# Patient Record
Sex: Female | Born: 1965 | Race: White | Hispanic: No | Marital: Single | Smoking: Never smoker
Health system: Southern US, Community
[De-identification: ages and names within clinical notes are randomized; demographics above are authoritative.]

## PROBLEM LIST (undated history)

## (undated) DIAGNOSIS — F419 Anxiety disorder, unspecified: Secondary | ICD-10-CM

## (undated) DIAGNOSIS — Z87442 Personal history of urinary calculi: Secondary | ICD-10-CM

## (undated) DIAGNOSIS — R350 Frequency of micturition: Secondary | ICD-10-CM

## (undated) DIAGNOSIS — Z8659 Personal history of other mental and behavioral disorders: Secondary | ICD-10-CM

## (undated) DIAGNOSIS — D649 Anemia, unspecified: Secondary | ICD-10-CM

## (undated) DIAGNOSIS — M629 Disorder of muscle, unspecified: Secondary | ICD-10-CM

## (undated) DIAGNOSIS — N259 Disorder resulting from impaired renal tubular function, unspecified: Secondary | ICD-10-CM

## (undated) DIAGNOSIS — F329 Major depressive disorder, single episode, unspecified: Secondary | ICD-10-CM

## (undated) DIAGNOSIS — R109 Unspecified abdominal pain: Secondary | ICD-10-CM

## (undated) DIAGNOSIS — T7840XA Allergy, unspecified, initial encounter: Secondary | ICD-10-CM

## (undated) DIAGNOSIS — Q613 Polycystic kidney, unspecified: Secondary | ICD-10-CM

## (undated) DIAGNOSIS — F1910 Other psychoactive substance abuse, uncomplicated: Secondary | ICD-10-CM

## (undated) HISTORY — DX: Anxiety disorder, unspecified: F41.9

## (undated) HISTORY — DX: Personal history of urinary calculi: Z87.442

## (undated) HISTORY — DX: Unspecified abdominal pain: R10.9

## (undated) HISTORY — DX: Allergy, unspecified, initial encounter: T78.40XA

## (undated) HISTORY — DX: Major depressive disorder, single episode, unspecified: F32.9

## (undated) HISTORY — DX: Anemia, unspecified: D64.9

## (undated) HISTORY — DX: Frequency of micturition: R35.0

## (undated) HISTORY — DX: Disorder of muscle, unspecified: M62.9

## (undated) HISTORY — PX: CYSTOSCOPY WITH URETEROSCOPY AND STENT PLACEMENT: SHX6377

## (undated) HISTORY — DX: Disorder resulting from impaired renal tubular function, unspecified: N25.9

## (undated) HISTORY — DX: Personal history of other mental and behavioral disorders: Z86.59

## (undated) HISTORY — DX: Polycystic kidney, unspecified: Q61.3

## (undated) HISTORY — DX: Other psychoactive substance abuse, uncomplicated: F19.10

## (undated) HISTORY — PX: THUMB AMPUTATION: SHX804

## (undated) HISTORY — PX: WISDOM TOOTH EXTRACTION: SHX21

---

## 1999-09-16 ENCOUNTER — Other Ambulatory Visit: Admission: RE | Admit: 1999-09-16 | Discharge: 1999-09-16 | Payer: Self-pay | Admitting: Family Medicine

## 2000-10-05 ENCOUNTER — Other Ambulatory Visit: Admission: RE | Admit: 2000-10-05 | Discharge: 2000-10-05 | Payer: Self-pay | Admitting: Family Medicine

## 2001-10-06 ENCOUNTER — Other Ambulatory Visit: Admission: RE | Admit: 2001-10-06 | Discharge: 2001-10-06 | Payer: Self-pay | Admitting: Family Medicine

## 2002-10-31 ENCOUNTER — Other Ambulatory Visit: Admission: RE | Admit: 2002-10-31 | Discharge: 2002-10-31 | Payer: Self-pay | Admitting: Family Medicine

## 2003-12-10 ENCOUNTER — Other Ambulatory Visit: Admission: RE | Admit: 2003-12-10 | Discharge: 2003-12-10 | Payer: Self-pay | Admitting: Family Medicine

## 2004-12-10 ENCOUNTER — Other Ambulatory Visit: Admission: RE | Admit: 2004-12-10 | Discharge: 2004-12-10 | Payer: Self-pay | Admitting: Family Medicine

## 2004-12-10 ENCOUNTER — Ambulatory Visit: Payer: Self-pay | Admitting: Family Medicine

## 2005-09-24 ENCOUNTER — Ambulatory Visit: Payer: Self-pay | Admitting: Family Medicine

## 2005-12-28 ENCOUNTER — Ambulatory Visit: Payer: Self-pay | Admitting: Family Medicine

## 2005-12-28 ENCOUNTER — Encounter: Payer: Self-pay | Admitting: Family Medicine

## 2005-12-28 ENCOUNTER — Other Ambulatory Visit: Admission: RE | Admit: 2005-12-28 | Discharge: 2005-12-28 | Payer: Self-pay | Admitting: Family Medicine

## 2006-09-02 ENCOUNTER — Encounter: Admission: RE | Admit: 2006-09-02 | Discharge: 2006-09-02 | Payer: Self-pay | Admitting: Family Medicine

## 2006-09-13 ENCOUNTER — Encounter: Admission: RE | Admit: 2006-09-13 | Discharge: 2006-09-13 | Payer: Self-pay | Admitting: Family Medicine

## 2006-09-23 ENCOUNTER — Encounter: Admission: RE | Admit: 2006-09-23 | Discharge: 2006-09-23 | Payer: Self-pay | Admitting: Family Medicine

## 2006-12-02 ENCOUNTER — Ambulatory Visit: Payer: Self-pay | Admitting: Family Medicine

## 2006-12-02 LAB — CONVERTED CEMR LAB
Albumin: 3.6 g/dL (ref 3.5–5.2)
BUN: 24 mg/dL — ABNORMAL HIGH (ref 6–23)
Basophils Relative: 0.1 % (ref 0.0–1.0)
Bilirubin, Direct: 0.1 mg/dL (ref 0.0–0.3)
CO2: 32 meq/L (ref 19–32)
Chloride: 103 meq/L (ref 96–112)
Eosinophils Absolute: 0.2 10*3/uL (ref 0.0–0.6)
Eosinophils Relative: 3.4 % (ref 0.0–5.0)
Glucose, Bld: 75 mg/dL (ref 70–99)
Lymphocytes Relative: 31 % (ref 12.0–46.0)
MCV: 98.9 fL (ref 78.0–100.0)
Monocytes Absolute: 0.4 10*3/uL (ref 0.2–0.7)
Neutro Abs: 3.1 10*3/uL (ref 1.4–7.7)
Neutrophils Relative %: 57.6 % (ref 43.0–77.0)
Platelets: 255 10*3/uL (ref 150–400)
Potassium: 2.8 meq/L — ABNORMAL LOW (ref 3.5–5.1)
RBC: 3.31 M/uL — ABNORMAL LOW (ref 3.87–5.11)
Total Bilirubin: 0.8 mg/dL (ref 0.3–1.2)
Total CHOL/HDL Ratio: 2.6
Total Protein: 6.6 g/dL (ref 6.0–8.3)
VLDL: 13 mg/dL (ref 0–40)

## 2006-12-08 ENCOUNTER — Encounter: Payer: Self-pay | Admitting: Family Medicine

## 2006-12-08 ENCOUNTER — Ambulatory Visit: Payer: Self-pay | Admitting: Family Medicine

## 2006-12-08 ENCOUNTER — Other Ambulatory Visit: Admission: RE | Admit: 2006-12-08 | Discharge: 2006-12-08 | Payer: Self-pay | Admitting: Family Medicine

## 2007-02-14 ENCOUNTER — Encounter: Payer: Self-pay | Admitting: Family Medicine

## 2007-02-24 ENCOUNTER — Ambulatory Visit: Payer: Self-pay | Admitting: Family Medicine

## 2007-02-24 LAB — CONVERTED CEMR LAB
Basophils Absolute: 0 10*3/uL (ref 0.0–0.1)
Basophils Relative: 0.1 % (ref 0.0–1.0)
Calcium: 9.4 mg/dL (ref 8.4–10.5)
Creatinine, Ser: 1.7 mg/dL — ABNORMAL HIGH (ref 0.4–1.2)
GFR calc Af Amer: 43 mL/min
Glucose, Bld: 103 mg/dL — ABNORMAL HIGH (ref 70–99)
Hemoglobin: 12.1 g/dL (ref 12.0–15.0)
MCHC: 35.4 g/dL (ref 30.0–36.0)
MCV: 97.2 fL (ref 78.0–100.0)
Monocytes Absolute: 0.4 10*3/uL (ref 0.2–0.7)
Neutro Abs: 4.3 10*3/uL (ref 1.4–7.7)
Platelets: 223 10*3/uL (ref 150–400)
RBC: 3.52 M/uL — ABNORMAL LOW (ref 3.87–5.11)
RDW: 11.3 % — ABNORMAL LOW (ref 11.5–14.6)

## 2007-03-03 ENCOUNTER — Ambulatory Visit: Payer: Self-pay | Admitting: Family Medicine

## 2007-03-03 LAB — CONVERTED CEMR LAB
BUN: 27 mg/dL — ABNORMAL HIGH (ref 6–23)
Calcium: 10 mg/dL (ref 8.4–10.5)
Glucose, Bld: 85 mg/dL (ref 70–99)
Sodium: 143 meq/L (ref 135–145)

## 2007-03-07 ENCOUNTER — Telehealth: Payer: Self-pay | Admitting: *Deleted

## 2007-06-28 ENCOUNTER — Telehealth: Payer: Self-pay | Admitting: Family Medicine

## 2007-08-01 ENCOUNTER — Telehealth: Payer: Self-pay | Admitting: Family Medicine

## 2007-08-14 ENCOUNTER — Encounter: Payer: Self-pay | Admitting: Family Medicine

## 2007-09-01 ENCOUNTER — Encounter: Admission: RE | Admit: 2007-09-01 | Discharge: 2007-09-01 | Payer: Self-pay | Admitting: Family Medicine

## 2007-09-06 ENCOUNTER — Ambulatory Visit: Payer: Self-pay | Admitting: Sports Medicine

## 2007-09-06 DIAGNOSIS — M629 Disorder of muscle, unspecified: Secondary | ICD-10-CM | POA: Insufficient documentation

## 2007-09-06 DIAGNOSIS — M2420 Disorder of ligament, unspecified site: Secondary | ICD-10-CM

## 2007-09-06 HISTORY — DX: Disorder of ligament, unspecified site: M24.20

## 2007-09-06 HISTORY — DX: Disorder of muscle, unspecified: M62.9

## 2007-10-06 ENCOUNTER — Ambulatory Visit: Payer: Self-pay | Admitting: Sports Medicine

## 2007-11-16 ENCOUNTER — Ambulatory Visit: Payer: Self-pay | Admitting: Family Medicine

## 2007-11-16 LAB — CONVERTED CEMR LAB
AST: 19 units/L (ref 0–37)
Albumin: 3.5 g/dL (ref 3.5–5.2)
Bilirubin, Direct: 0.1 mg/dL (ref 0.0–0.3)
Blood in Urine, dipstick: NEGATIVE
Calcium: 8.8 mg/dL (ref 8.4–10.5)
Chloride: 100 meq/L (ref 96–112)
Creatinine, Ser: 1.6 mg/dL — ABNORMAL HIGH (ref 0.4–1.2)
Folate: 17.2 ng/mL
GFR calc Af Amer: 46 mL/min
GFR calc non Af Amer: 38 mL/min
Glucose, Bld: 71 mg/dL (ref 70–99)
HCT: 31.9 % — ABNORMAL LOW (ref 36.0–46.0)
HDL: 69.2 mg/dL (ref >39.0)
Hemoglobin: 10.6 g/dL — ABNORMAL LOW (ref 12.0–15.0)
Ketones, urine, test strip: NEGATIVE
LDL Cholesterol: 99 mg/dL (ref 0–99)
MCV: 100.7 fL — ABNORMAL HIGH (ref 78.0–100.0)
Nitrite: NEGATIVE
RBC: 3.17 M/uL — ABNORMAL LOW (ref 3.87–5.11)
Saturation Ratios: 44.4 % (ref 20.0–50.0)
Specific Gravity, Urine: 1.015
Total Bilirubin: 0.7 mg/dL (ref 0.3–1.2)
Transferrin: 212.5 mg/dL (ref >=212.0)
Urobilinogen, UA: 0.2
VLDL: 14 mg/dL (ref 0–40)
Vitamin B-12: 497 pg/mL (ref 211–911)
WBC Urine, dipstick: NEGATIVE
WBC: 4.5 10*3/uL (ref 4.5–10.5)

## 2007-12-02 DIAGNOSIS — R109 Unspecified abdominal pain: Secondary | ICD-10-CM

## 2007-12-02 HISTORY — DX: Unspecified abdominal pain: R10.9

## 2007-12-04 ENCOUNTER — Telehealth: Payer: Self-pay | Admitting: Family Medicine

## 2007-12-04 ENCOUNTER — Ambulatory Visit: Payer: Self-pay | Admitting: Family Medicine

## 2007-12-04 DIAGNOSIS — F329 Major depressive disorder, single episode, unspecified: Secondary | ICD-10-CM

## 2007-12-04 DIAGNOSIS — F3289 Other specified depressive episodes: Secondary | ICD-10-CM

## 2007-12-04 DIAGNOSIS — R350 Frequency of micturition: Secondary | ICD-10-CM

## 2007-12-04 HISTORY — DX: Major depressive disorder, single episode, unspecified: F32.9

## 2007-12-04 HISTORY — DX: Frequency of micturition: R35.0

## 2007-12-04 HISTORY — DX: Other specified depressive episodes: F32.89

## 2007-12-04 LAB — CONVERTED CEMR LAB
Ketones, urine, test strip: NEGATIVE
Protein, U semiquant: NEGATIVE
Specific Gravity, Urine: 1.01

## 2007-12-05 ENCOUNTER — Ambulatory Visit: Payer: Self-pay | Admitting: Cardiology

## 2007-12-05 ENCOUNTER — Telehealth: Payer: Self-pay | Admitting: Family Medicine

## 2007-12-05 ENCOUNTER — Ambulatory Visit: Payer: Self-pay | Admitting: Family Medicine

## 2007-12-05 ENCOUNTER — Telehealth (INDEPENDENT_AMBULATORY_CARE_PROVIDER_SITE_OTHER): Payer: Self-pay | Admitting: *Deleted

## 2007-12-05 LAB — CONVERTED CEMR LAB
Blood in Urine, dipstick: NEGATIVE
pH: 5

## 2007-12-08 ENCOUNTER — Telehealth: Payer: Self-pay | Admitting: Family Medicine

## 2007-12-12 ENCOUNTER — Ambulatory Visit: Payer: Self-pay | Admitting: Family Medicine

## 2007-12-12 ENCOUNTER — Other Ambulatory Visit: Admission: RE | Admit: 2007-12-12 | Discharge: 2007-12-12 | Payer: Self-pay | Admitting: Family Medicine

## 2007-12-12 ENCOUNTER — Telehealth: Payer: Self-pay | Admitting: Family Medicine

## 2007-12-12 ENCOUNTER — Encounter: Payer: Self-pay | Admitting: Family Medicine

## 2007-12-12 ENCOUNTER — Telehealth (INDEPENDENT_AMBULATORY_CARE_PROVIDER_SITE_OTHER): Payer: Self-pay | Admitting: *Deleted

## 2007-12-12 DIAGNOSIS — N259 Disorder resulting from impaired renal tubular function, unspecified: Secondary | ICD-10-CM

## 2007-12-12 DIAGNOSIS — Z87442 Personal history of urinary calculi: Secondary | ICD-10-CM

## 2007-12-12 DIAGNOSIS — Z8659 Personal history of other mental and behavioral disorders: Secondary | ICD-10-CM

## 2007-12-12 HISTORY — DX: Personal history of urinary calculi: Z87.442

## 2007-12-12 HISTORY — DX: Disorder resulting from impaired renal tubular function, unspecified: N25.9

## 2007-12-12 HISTORY — DX: Personal history of other mental and behavioral disorders: Z86.59

## 2008-01-12 ENCOUNTER — Ambulatory Visit: Payer: Self-pay

## 2008-01-12 ENCOUNTER — Ambulatory Visit (HOSPITAL_COMMUNITY): Admission: RE | Admit: 2008-01-12 | Discharge: 2008-01-12 | Payer: Self-pay | Admitting: Urology

## 2008-01-12 ENCOUNTER — Emergency Department (HOSPITAL_COMMUNITY): Admission: EM | Admit: 2008-01-12 | Discharge: 2008-01-12 | Payer: Self-pay | Admitting: Emergency Medicine

## 2008-01-29 ENCOUNTER — Ambulatory Visit (HOSPITAL_BASED_OUTPATIENT_CLINIC_OR_DEPARTMENT_OTHER): Admission: RE | Admit: 2008-01-29 | Discharge: 2008-01-29 | Payer: Self-pay | Admitting: Urology

## 2008-01-29 ENCOUNTER — Encounter (INDEPENDENT_AMBULATORY_CARE_PROVIDER_SITE_OTHER): Payer: Self-pay | Admitting: Urology

## 2008-02-06 ENCOUNTER — Encounter: Admission: RE | Admit: 2008-02-06 | Discharge: 2008-02-06 | Payer: Self-pay | Admitting: Nephrology

## 2008-04-22 IMAGING — CT CT PELVIS W/O CM
2 of 4 series · 16 of 46 positions shown, 18 images · non-contrast
Comparison: None

CT ABDOMEN

CLINICAL DATA: RIGHT FLANK PAIN.  FREQUENCY..  EDEMA OF BILATERAL
LOWER EXTREMITIES.  HISTORY OF KIDNEY STONES.

CT ABDOMEN AND PELVIS WITHOUT CONTRAST (CT UROGRAM)
TECHNIQUE: Contiguous axial images of the abdomen and pelvis
without oral or intravenous contrast were obtained.

[Series 2: abd_pel_w/o 5.0 b30f st · axial · 0.68mm/px · z∈[-407,-37]mm · 13 of 82 slices shown, 15 images]
[im 4/82  soft-tissue]
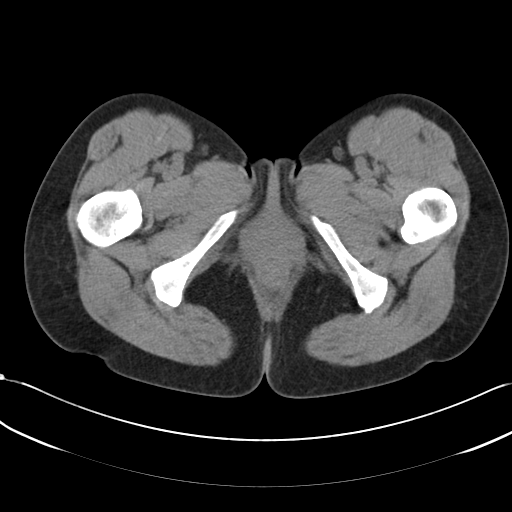
[im 4/82  bone]
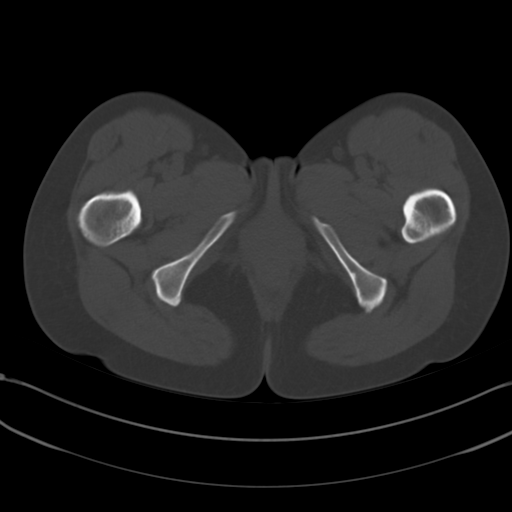
[im 11/82  soft-tissue]
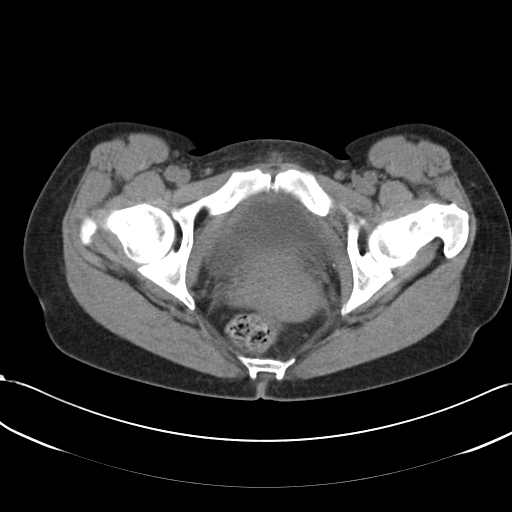
[im 18/82  soft-tissue]
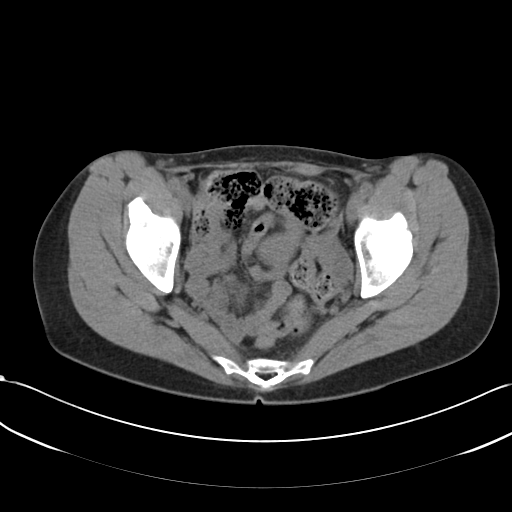
[im 22/82  soft-tissue]
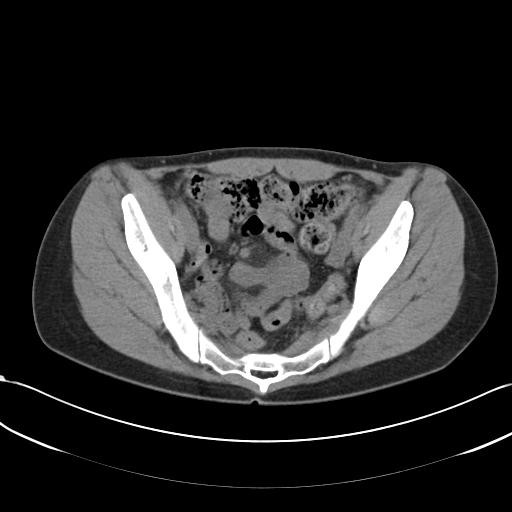
[im 29/82  soft-tissue]
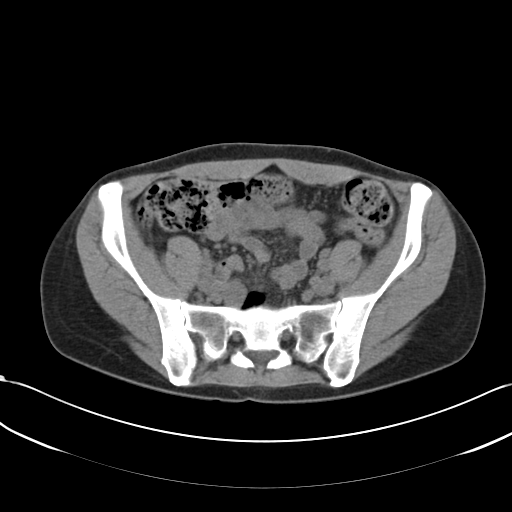
[im 36/82  soft-tissue]
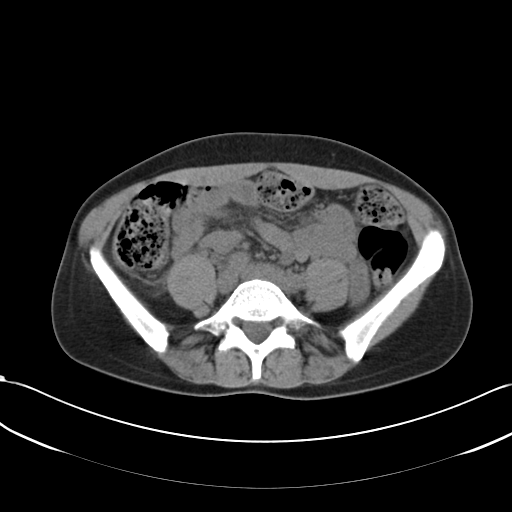
[im 43/82  soft-tissue]
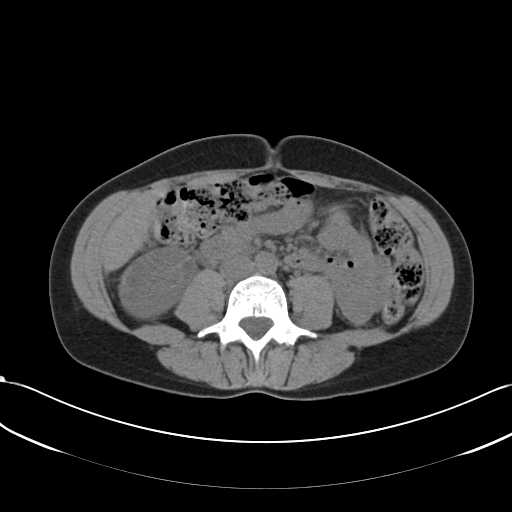
[im 46/82  soft-tissue]
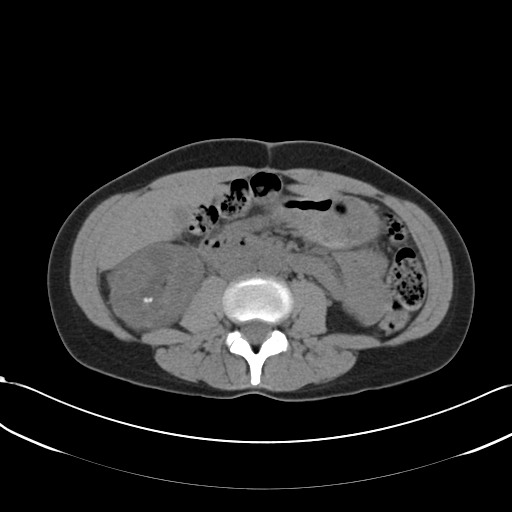
[im 53/82  soft-tissue]
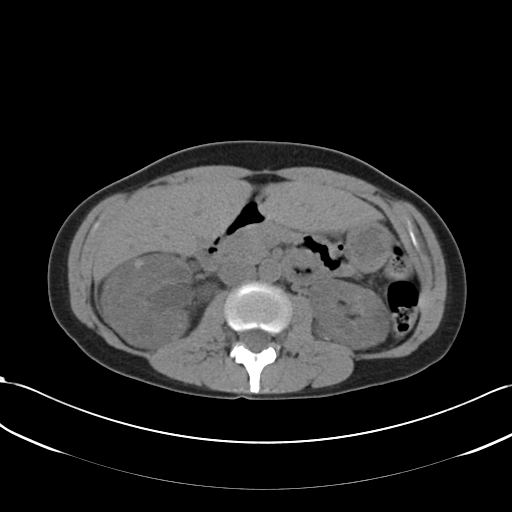
[im 53/82  bone]
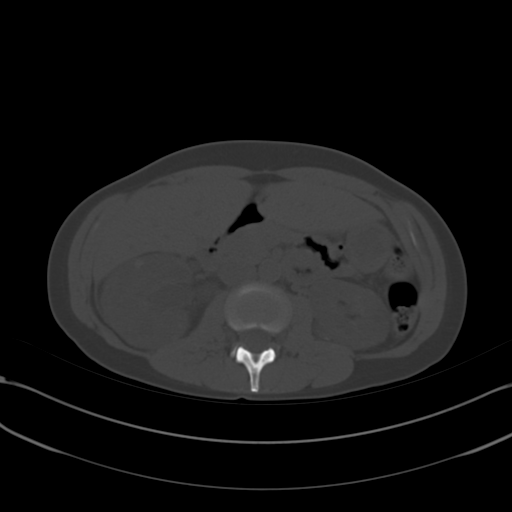
[im 60/82  soft-tissue]
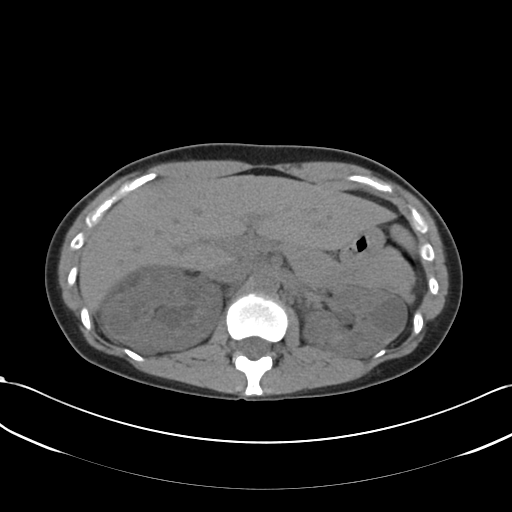
[im 64/82  soft-tissue]
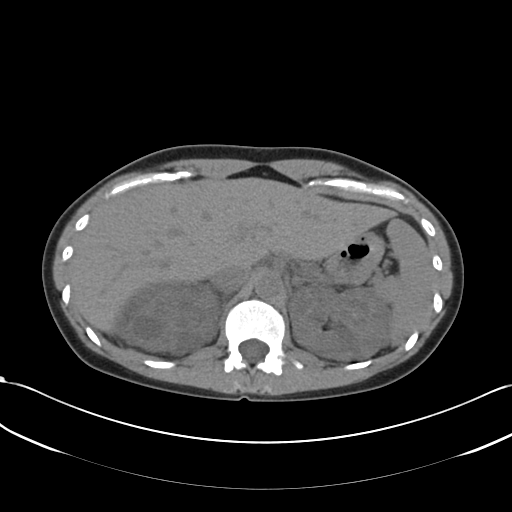
[im 71/82  soft-tissue]
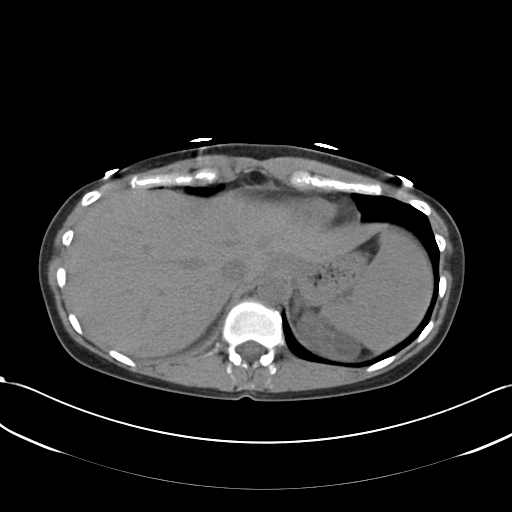
[im 78/82  soft-tissue]
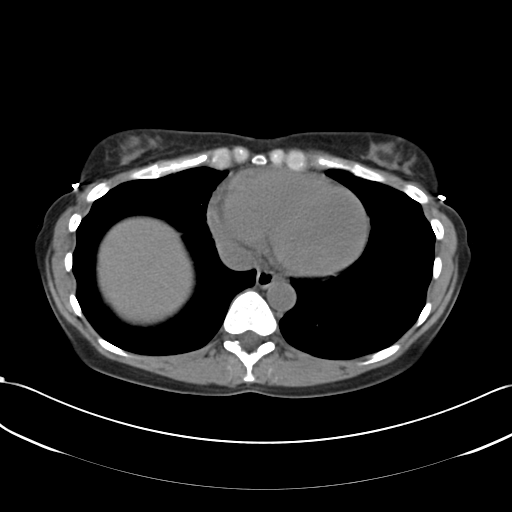

[Series 602: <mpr range> · coronal · 0.81mm/px · 3 of 172 slices shown]
[im 58/172  soft-tissue]
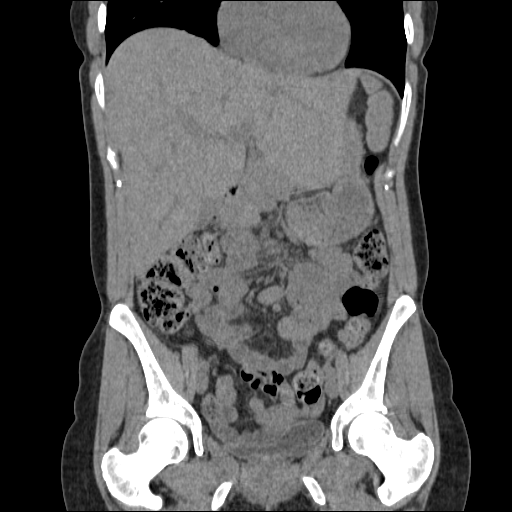
[im 77/172  soft-tissue]
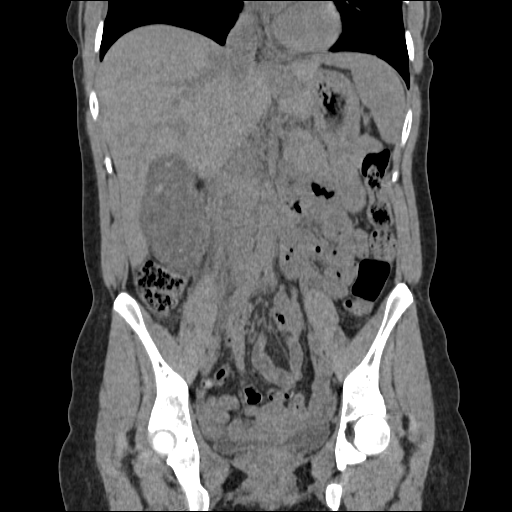
[im 96/172  soft-tissue]
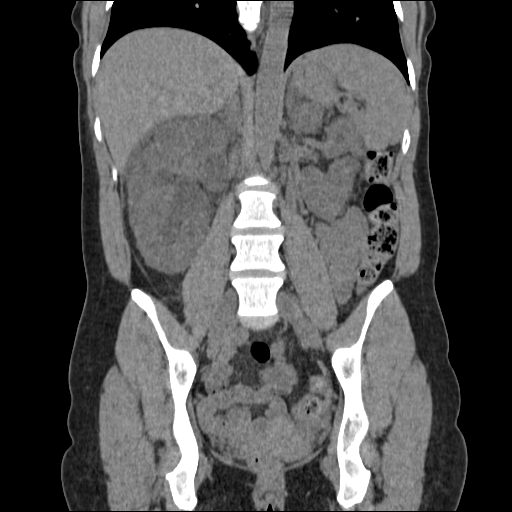

[16 of 46 positions shown; findings below may reference images not displayed]

FINDINGS: Exam is limited for evaluation of entities other than
urinary tract calculi due to lack of oral or intravenous contrast.

 A subpleural right middle lobe 2 mm nodule on image five which is
likely a subpleural lymph node.  Probable anemia as evidenced by
decreased density of the intravascular spaces.  No pericardial or
pleural effusion.  Normal unfused appearance of liver.  Old
granulomatous disease in the spleen.  Normal stomach, pancreas.
Gallbladder is likely contracted.  Normal caliber common duct.  The
parenchyma of both kidneys is replaced by numerous masses likely
cysts and complex cysts.  There is mild right-sided perirenal edema
and hydronephrosis.  Bilateral hemorrhagic renal lesions
identified.  Right lower pole punctate renal calculi.  Right
hydroureter throughout the abdomen.  Small retroperitoneal lymph
nodes but no adenopathy.  Normal colon. The appendix is not
visualized but there is no evidence of right lower quadrant
inflammation.

Normal abdominal small bowel loops without ascites.
IMPRESSION: 1.  Right renal obstructive signs due to distal calculi to be
described below.
2.  Numerous bilateral renal masses suspicious for adult onset
polycystic kidney disease.

3.  Punctate right renal calculi.
4.  Probable anemia.

CT PELVIS
FINDINGS: The distal right ureters are difficult to follow.  A 1
mm calcification is likely the right bladder base on image 71.
Immediately posterior, a second one - 2 mm calcification may be at
the ureterovesicular junction or be vascular in nature.  Normal
pelvic bowel loops.  No pelvic adenopathy.  Normal uterus.  No
adnexal mass. Normal bones.
IMPRESSION: 1.
1.  One and possibly 2 distal right-sided urinary tract calculi,
punctate, as described with right renal obstructive signs.

## 2008-06-28 ENCOUNTER — Telehealth: Payer: Self-pay | Admitting: *Deleted

## 2008-07-25 ENCOUNTER — Telehealth: Payer: Self-pay | Admitting: Internal Medicine

## 2008-09-27 ENCOUNTER — Encounter: Admission: RE | Admit: 2008-09-27 | Discharge: 2008-09-27 | Payer: Self-pay | Admitting: Family Medicine

## 2008-12-06 ENCOUNTER — Ambulatory Visit: Payer: Self-pay | Admitting: Family Medicine

## 2008-12-06 LAB — CONVERTED CEMR LAB
ALT: 14 units/L (ref 0–35)
AST: 20 units/L (ref 0–37)
Albumin: 3.8 g/dL (ref 3.5–5.2)
BUN: 32 mg/dL — ABNORMAL HIGH (ref 6–23)
Blood in Urine, dipstick: NEGATIVE
CO2: 29 meq/L (ref 19–32)
Chloride: 107 meq/L (ref 96–112)
Cholesterol: 213 mg/dL — ABNORMAL HIGH (ref 0–200)
Direct LDL: 107.1 mg/dL
Eosinophils Relative: 3.4 % (ref 0.0–5.0)
HDL: 80.6 mg/dL (ref >39.00)
Ketones, urine, test strip: NEGATIVE
Lymphocytes Relative: 19.6 % (ref 12.0–46.0)
MCHC: 34.8 g/dL (ref 30.0–36.0)
Neutro Abs: 4.9 10*3/uL (ref 1.4–7.7)
Neutrophils Relative %: 69.3 % (ref 43.0–77.0)
Nitrite: NEGATIVE
Platelets: 203 10*3/uL (ref 150.0–400.0)
Specific Gravity, Urine: 1.015
TSH: 2.56 microintl units/mL (ref 0.35–5.50)
Total CHOL/HDL Ratio: 3
Triglycerides: 94 mg/dL (ref 0.0–149.0)
Urobilinogen, UA: 0.2
WBC Urine, dipstick: NEGATIVE
WBC: 7 10*3/uL (ref 4.5–10.5)
pH: 5

## 2008-12-13 ENCOUNTER — Telehealth: Payer: Self-pay | Admitting: Family Medicine

## 2008-12-13 ENCOUNTER — Ambulatory Visit: Payer: Self-pay | Admitting: Family Medicine

## 2008-12-13 DIAGNOSIS — Q613 Polycystic kidney, unspecified: Secondary | ICD-10-CM | POA: Insufficient documentation

## 2008-12-13 HISTORY — DX: Polycystic kidney, unspecified: Q61.3

## 2009-03-19 ENCOUNTER — Encounter (HOSPITAL_COMMUNITY): Admission: RE | Admit: 2009-03-19 | Discharge: 2009-06-17 | Payer: Self-pay | Admitting: Nephrology

## 2009-06-30 ENCOUNTER — Telehealth: Payer: Self-pay | Admitting: Family Medicine

## 2009-07-01 ENCOUNTER — Encounter (HOSPITAL_COMMUNITY): Admission: RE | Admit: 2009-07-01 | Discharge: 2009-07-01 | Payer: Self-pay | Admitting: Nephrology

## 2009-07-15 ENCOUNTER — Telehealth: Payer: Self-pay | Admitting: Family Medicine

## 2009-11-14 ENCOUNTER — Encounter: Admission: RE | Admit: 2009-11-14 | Discharge: 2009-11-14 | Payer: Self-pay | Admitting: Family Medicine

## 2009-12-19 ENCOUNTER — Other Ambulatory Visit: Admission: RE | Admit: 2009-12-19 | Discharge: 2009-12-19 | Payer: Self-pay | Admitting: Family Medicine

## 2009-12-19 ENCOUNTER — Ambulatory Visit: Payer: Self-pay | Admitting: Family Medicine

## 2009-12-24 ENCOUNTER — Telehealth: Payer: Self-pay | Admitting: *Deleted

## 2009-12-31 ENCOUNTER — Encounter (HOSPITAL_COMMUNITY): Admission: RE | Admit: 2009-12-31 | Discharge: 2010-03-25 | Payer: Self-pay | Admitting: Nephrology

## 2010-01-11 LAB — HM MAMMOGRAPHY: HM Mammogram: NEGATIVE

## 2010-03-23 ENCOUNTER — Telehealth: Payer: Self-pay | Admitting: Family Medicine

## 2010-06-17 ENCOUNTER — Telehealth: Payer: Self-pay | Admitting: Family Medicine

## 2010-09-06 ENCOUNTER — Encounter: Payer: Self-pay | Admitting: Family Medicine

## 2010-09-07 ENCOUNTER — Encounter: Payer: Self-pay | Admitting: Urology

## 2010-09-15 NOTE — Progress Notes (Signed)
Summary: increase of meds.  Phone Note Call from Patient   Summary of Call: Pt takes 150 mg. of generic Zoloft, and needs 3 months supply with refills.   [facility]  She increased her own dosage. [phone] Initial call taken by: Lynann Beaver CMA,  March 23, 2010 9:06 AM  Follow-up for Phone Call        ok for i yr Follow-up by: Roderick Pee MD,  March 23, 2010 10:17 AM    Prescriptions: ZOLOFT 100 MG TABS (SERTRALINE HCL) 1 tab @ bedtime  #90 x 3   Entered by:   Kern Reap CMA (AAMA)   Authorized by:   Roderick Pee MD   Signed by:   Kern Reap CMA (AAMA) on 03/23/2010   Method used:   Electronically to        [facility]  [phone]* (retail)       [address] [place], [place]  [address]       Ph: [phone] or [phone]       Fax: [phone]   RxID:   [id number] ZOLOFT 50 MG TABS (SERTRALINE HCL) 1 tab @ bedtime  #90 x 3   Entered by:   Kern Reap CMA (AAMA)   Authorized by:   Roderick Pee MD   Signed by:   Kern Reap CMA (AAMA) on 03/23/2010   Method used:   Electronically to        [facility]  [phone]* (retail)       [address] [place], [place]  [address]       Ph: [phone] or [phone]       Fax: [phone]   RxID:   [id number]

## 2010-09-15 NOTE — Progress Notes (Signed)
Summary: new rx  Phone Note Call from Patient Call back at Work Phone [phone]   Caller: Patient Call For: Catherine Pee MD Summary of Call: pt needs generic ritalin 20 mg twice a day. Initial call taken by: Heron Sabins,  Dec 24, 2009 10:41 AM  Follow-up for Phone Call        rx ready for pick up Follow-up by: Kern Reap CMA Duncan Dull),  Dec 24, 2009 11:16 AM    Prescriptions: METHYLPHENIDATE HCL CR 20 MG CR-TABS (METHYLPHENIDATE HCL) take one tablet two times a day  fill in two months  #60 x 0   Entered by:   Kern Reap CMA (AAMA)   Authorized by:   Catherine Pee MD   Signed by:   Kern Reap CMA (AAMA) on 12/24/2009   Method used:   Print then Give to Patient   RxID:   [id number] METHYLPHENIDATE HCL CR 20 MG CR-TABS (METHYLPHENIDATE HCL) take one tablet two times a day fill in one month  #60 x 0   Entered by:   Kern Reap CMA (AAMA)   Authorized by:   Catherine Pee MD   Signed by:   Kern Reap CMA (AAMA) on 12/24/2009   Method used:   Print then Give to Patient   RxID:   [id number] METHYLPHENIDATE HCL CR 20 MG CR-TABS (METHYLPHENIDATE HCL) take one tablet two times a day  #60 x 0   Entered by:   Kern Reap CMA (AAMA)   Authorized by:   Catherine Pee MD   Signed by:   Kern Reap CMA (AAMA) on 12/24/2009   Method used:   Print then Give to Patient   RxID:   [id number]

## 2010-09-15 NOTE — Assessment & Plan Note (Signed)
Summary: cpx/pap/njr   Vital Signs:  Patient profile:   45 year old female Menstrual status:  irregular Height:      62.5 inches Weight:      106 pounds BMI:     19.15 Temp:     97.8 degrees F oral  Vitals Entered By: Kern Reap CMA Duncan Dull) (Dec 19, 2009 10:44 AM) CC: cpx   CC:  cpx.  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  She is followed by Dr[facility] Raquel [facility].  Her parameters are stable except her hemoglobin is low.  The Procrit is $400 a shot.  She has applied for patient assistance.  She takes methylphenidate 20 mg long-acting b.i.d. for ADD.  She also takes Maxide 25 mg q.a.m. for fluid retention.  She also takes Urised K2 tabs daily from Dr. Shela Commons. D. to prevent kidney stones.  She takes a stool softener on a somewhat daily basis.  She is also due to go see her dermatologist, Dr. Yetta Barre because of some nonhealing lesions, one on her leg, and one on her left hand.  She also takes acyclovir 200 mg daily.  She takes Claritin-D for allergic rhinitis.  She also takes Luvox for mild depression.  However, she is wants to switch to Zoloft because she feels the Luvox is causing sedation  Past History:  Past medical, surgical, family and social histories (including risk factors) reviewed, and no changes noted (except as noted below).  Past Medical History: Reviewed history from 12/12/2007 and no changes required. bulemia ADD Depression  amputation tip of left thumb Renal insufficiency secondary to polycystic kidney disease Nephrolithiasis, hx of  Family History: Reviewed history from 12/04/2007 and no changes required. father polycystic kidneys on the transplant list  mother in good healthno brothers.  One sister in good health  Social History: Reviewed history from 12/04/2007 and no changes required. Avid  runner. 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 Occupation: Single Never Smoked Alcohol use-no Drug use-no Regular exercise-yes  Review of Systems      See HPI  Physical Exam  General:  Well-developed,well-nourished,in no acute distress; alert,appropriate and cooperative throughout examination Head:  Normocephalic and atraumatic without obvious abnormalities. No apparent alopecia or balding. Eyes:  No corneal or conjunctival inflammation noted. EOMI. Perrla. Funduscopic exam benign, without hemorrhages, exudates or papilledema. Vision grossly normal. Ears:  External ear exam shows no significant lesions or deformities.  Otoscopic examination reveals clear canals, tympanic membranes are intact bilaterally without bulging, retraction, inflammation or discharge. Hearing is grossly normal bilaterally. Nose:  External nasal examination shows no deformity or inflammation. Nasal mucosa are pink and moist without lesions or exudates. Mouth:  Oral mucosa and oropharynx without lesions or exudates.  Teeth in good repair. Neck:  No deformities, masses, or tenderness noted. Chest Wall:  No deformities, masses, or tenderness noted. Breasts:  No mass, nodules, thickening, tenderness, bulging, retraction, inflamation, nipple discharge or skin changes noted.   Lungs:  Normal respiratory effort, chest expands symmetrically. Lungs are clear to auscultation, no crackles or wheezes. Heart:  Normal rate and regular rhythm. S1 and S2 normal without gallop, murmur, click, rub or other extra sounds. Abdomen:  Bowel sounds positive,abdomen soft and non-tender without masses, organomegaly or hernias noted. Rectal:  No external abnormalities noted. Normal sphincter tone. No rectal masses or tenderness. Genitalia:  Pelvic Exam:  External: normal female genitalia without lesions or masses        Vagina: normal without lesions or masses        Cervix: normal without lesions or masses        Adnexa:  normal bimanual exam without masses or fullness        Uterus: normal by palpation        Pap smear: performed Msk:  No deformity or scoliosis noted of thoracic or lumbar spine.   Pulses:  R and L carotid,radial,femoral,dorsalis pedis and posterior tibial pulses are full and equal bilaterally Extremities:  No clubbing, cyanosis, edema, or deformity noted with normal full range of motion of all joints.   Neurologic:  No cranial nerve deficits noted. Station and gait are normal. Plantar reflexes are down-going bilaterally. DTRs are symmetrical throughout. Sensory, motor and coordinative functions appear intact. Skin:  there is a lesion that appears on her right posterior calf probable a.k.a. also a couple other lesions that are nonhealing referred to Dr. Yetta Barre.  Dermatology for treatment Cervical Nodes:  No lymphadenopathy noted Axillary Nodes:  No palpable lymphadenopathy Inguinal Nodes:  No significant adenopathy Psych:  Cognition and judgment appear intact. Alert and cooperative with normal attention span and concentration. No apparent delusions, illusions, hallucinations   Impression & Recommendations:  Problem # 1:  POLYCYSTIC KIDNEY DISEASE (ICD-753.12) Assessment Unchanged  Orders: Prescription Created Electronically [phone]) EKG w/ Interpretation (93000)  Problem # 2:  ROUTINE GENERAL MEDICAL EXAM@HEALTH  CARE FACL (ICD-V70.0) Assessment: Unchanged  Orders: Prescription Created Electronically [phone]) Venipuncture (09811) EKG w/ Interpretation (93000) TLB-TSH (Thyroid Stimulating Hormone) (84443-TSH)  Problem # 3:  DEPRESSION (ICD-311) Assessment: Improved  Her updated medication list for this problem includes:    Luvox Cr 100 Mg Xr24h-cap (Fluvoxamine maleate) .Marland Kitchen... Take one and half daily plain luvox    Zoloft 50 Mg Tabs (Sertraline hcl) .Marland Kitchen... 1 tab @ bedtime    Zoloft 100 Mg Tabs (Sertraline hcl) .Marland Kitchen... 1 tab @ bedtime  Orders: Prescription Created Electronically  [phone])  Complete Medication List: 1)  Acyclovir 200 Mg Caps (Acyclovir) .... Once daily capsule 2)  Triamterene-hctz 75-50 Mg Tabs (Triamterene-hctz) .... Once daily----- 3)  Claritin-d 24 Hour 10-240 Mg Tb24 (Loratadine-pseudoephedrine) .... Once daily 4)  Klor-con M20 20 Meq Tbcr (Potassium chloride crys cr) .... As needed 5)  Metamucil 30.9 % Powd (Psyllium) .... At bedtime 6)  [facility]vs Stool Softener 100 Mg Caps (Docusate sodium) .... Two qhs 7)  Methylphenidate Hcl Cr 20 Mg Cr-tabs (Methylphenidate hcl) .... Take one tablet two times a day 8)  Methylphenidate Hcl Cr 20 Mg Cr-tabs (Methylphenidate hcl) .... Take one tablet two times a day fill in one month 9)  Methylphenidate Hcl Cr 20 Mg Cr-tabs (Methylphenidate hcl) .... Take one tablet two times a day  fill in two months 10)  Luvox Cr 100 Mg Xr24h-cap (Fluvoxamine maleate) .... Take one and half daily plain luvox 11)  Zoloft 50 Mg Tabs (Sertraline hcl) .Marland Kitchen.. 1 tab @ bedtime 12)  Zoloft 100 Mg Tabs (Sertraline hcl) .Marland Kitchen.. 1 tab @ bedtime  Patient Instructions: 1)  taper off the Luvox, and start the Zoloft as outlined.  Call me if you have any problems. 2)  Continue your other medications. 3)  Remember to do your breast exam monthly.  In addition to getting a mammogram yearly Prescriptions: ZOLOFT 100 MG TABS (SERTRALINE HCL) 1 tab @ bedtime  #100 x 3   Entered and Authorized by:   Roderick Pee MD  Signed by:   Roderick Pee MD on 12/19/2009   Method used:   Print then Give to Patient   RxID:   [id number] KLOR-CON M20 20 MEQ  TBCR (POTASSIUM CHLORIDE CRYS CR) as needed  #100 Tablet x 3   Entered and Authorized by:   Roderick Pee MD   Signed by:   Roderick Pee MD on 12/19/2009   Method used:   [facility]ron[facility] to        [facility]  [phone]* (retail)       [address] [place], [place]  [address]       Ph: [phone] or [phone]       Fax: [phone]   RxID:   [id number] LUVOX CR 100 MG XR24H-CAP  (FLUVOXAMINE MALEATE) take one and half daily plain Luvox  #100 x 1   Entered and Authorized by:   Roderick Pee MD   Signed by:   Roderick Pee MD on 12/19/2009   Method used:   Electronically to        [facility]  [phone]* (retail)       [address] [place], [place]  [address]       Ph: [phone] or [phone]       Fax: [phone]   RxID:   [id number] TRIAMTERENE-HCTZ 75-50 MG  TABS (TRIAMTERENE-HCTZ) once daily-----  #100 x 4   Entered and Authorized by:   Roderick Pee MD   Signed by:   Roderick Pee MD on 12/19/2009   Method used:   Electronically to        [facility]  [phone]* (retail)       [address] [place], [place]  [address]       Ph: [phone] or [phone]       Fax: [phone]   RxID:   [id number] ACYCLOVIR 200 MG  CAPS (ACYCLOVIR) once daily capsule  #100 x 4   Entered and Authorized by:   Roderick Pee MD   Signed by:   Roderick Pee MD on 12/19/2009   Method used:   Electronically to        [facility]  [phone]* (retail)       [address] [place], [place]  [address]       Ph: [phone] or [phone]       Fax: [phone]   RxID:   [id number] ZOLOFT 50 MG TABS (SERTRALINE HCL) 1 tab @ bedtime  #50 x 0   Entered and Authorized by:   Roderick Pee MD   Signed by:   Roderick Pee MD on 12/19/2009   Method used:   Electronically to        [facility]  [phone]* (retail)       [address] [place], [place]  [address]       Ph: [phone] or [phone]       Fax: [phone]   RxID:   [id number]    Immunization History:  Influenza Immunization History:    Influenza:  historical (05/16/2009)

## 2010-09-15 NOTE — Progress Notes (Signed)
Summary: REFILL REQUEST  Phone Note Refill Request Message from:  Patient on June 17, 2010 1:47 PM  Refills Requested: Medication #1:  METHYLPHENIDATE HCL CR 20 MG CR-TABS take one tablet two times a day   Notes: Pt can be reached at [phone] when Rx is ready for p/u.    Initial call taken by: Debbra Riding,  June 17, 2010 1:48 PM    Prescriptions: METHYLPHENIDATE HCL CR 20 MG CR-TABS (METHYLPHENIDATE HCL) take one tablet two times a day  fill in two months  #60 x 0   Entered by:   Kern Reap CMA (AAMA)   Authorized by:   Roderick Pee MD   Signed by:   Kern Reap CMA (AAMA) on 06/18/2010   Method used:   Print then Give to Patient   RxID:   [id number] METHYLPHENIDATE HCL CR 20 MG CR-TABS (METHYLPHENIDATE HCL) take one tablet two times a day fill in one month  #60 x 0   Entered by:   Kern Reap CMA (AAMA)   Authorized by:   Roderick Pee MD   Signed by:   Kern Reap CMA (AAMA) on 06/18/2010   Method used:   Print then Give to Patient   RxID:   [id number] METHYLPHENIDATE HCL CR 20 MG CR-TABS (METHYLPHENIDATE HCL) take one tablet two times a day  #60 x 0   Entered by:   Kern Reap CMA (AAMA)   Authorized by:   Roderick Pee MD   Signed by:   Kern Reap CMA (AAMA) on 06/18/2010   Method used:   Print then Give to Patient   RxID:   [id number]

## 2010-11-02 LAB — IRON AND TIBC
Saturation Ratios: 22 % (ref 20–55)
TIBC: 325 ug/dL (ref 250–470)
UIBC: 255 ug/dL

## 2010-11-18 LAB — POCT HEMOGLOBIN-HEMACUE: Hemoglobin: 10.7 g/dL — ABNORMAL LOW (ref 12.0–15.0)

## 2010-11-19 LAB — POCT HEMOGLOBIN-HEMACUE
Hemoglobin: 11.6 g/dL — ABNORMAL LOW (ref 12.0–15.0)
Hemoglobin: 11.3 g/dL — ABNORMAL LOW (ref 12.0–15.0)

## 2010-11-19 LAB — IRON AND TIBC
Iron: 112 ug/dL (ref 42–135)
Saturation Ratios: 36 % (ref 20–55)
TIBC: 308 ug/dL (ref 250–470)
UIBC: 196 ug/dL

## 2010-11-19 LAB — FERRITIN: Ferritin: 61 ng/mL (ref 10–291)

## 2010-11-20 LAB — CBC
MCHC: 34.4 g/dL (ref 30.0–36.0)
MCV: 99.8 fL (ref 78.0–100.0)
Platelets: 204 10*3/uL (ref 150–400)
RDW: 12.1 % (ref 11.5–15.5)

## 2010-11-20 LAB — FERRITIN: Ferritin: 48 ng/mL (ref 10–291)

## 2010-11-20 LAB — IRON AND TIBC: UIBC: 238 ug/dL

## 2010-11-21 LAB — POCT HEMOGLOBIN-HEMACUE: Hemoglobin: 10.8 g/dL — ABNORMAL LOW (ref 12.0–15.0)

## 2010-11-21 LAB — IRON AND TIBC
Iron: 75 ug/dL (ref 42–135)
Saturation Ratios: 22 % (ref 20–55)
TIBC: 335 ug/dL (ref 250–470)

## 2010-11-26 ENCOUNTER — Other Ambulatory Visit: Payer: Self-pay | Admitting: Family Medicine

## 2010-11-26 DIAGNOSIS — Z1231 Encounter for screening mammogram for malignant neoplasm of breast: Secondary | ICD-10-CM

## 2010-12-11 ENCOUNTER — Ambulatory Visit
Admission: RE | Admit: 2010-12-11 | Discharge: 2010-12-11 | Disposition: A | Discharge status: Home / Self Care | Payer: BC Managed Care – PPO | Source: Ambulatory Visit | Attending: Family Medicine | Admitting: Family Medicine

## 2010-12-11 DIAGNOSIS — Z1231 Encounter for screening mammogram for malignant neoplasm of breast: Secondary | ICD-10-CM

## 2010-12-24 ENCOUNTER — Other Ambulatory Visit: Payer: Self-pay | Admitting: *Deleted

## 2010-12-24 MED ORDER — METHYLPHENIDATE HCL 20 MG PO TABS: 20.0000 mg | ORAL_TABLET | Freq: Two times a day (BID) | ORAL | Status: DC

## 2010-12-29 ENCOUNTER — Other Ambulatory Visit: Payer: Self-pay | Admitting: Family Medicine

## 2010-12-29 NOTE — Op Note (Signed)
NAMESOPHINA, MITTEN            ACCOUNT NO.:  [id number]   MEDICAL RECORD NO.:  [id number]          PATIENT TYPE:  AMB   LOCATION:  [facility]                         FACILITY:  [facility]   PHYSICIAN:  Heloise Purpura, MD      DATE OF BIRTH:  [birth year]   DATE OF PROCEDURE:  01/29/2008  DATE OF DISCHARGE:                               OPERATIVE REPORT   PREOPERATIVE DIAGNOSES:  1. Autosomal dominant polycystic kidney disease.  2. Renal insufficiency.  3. Right ureteral obstruction, presumably due to right ureteral      stricture.   POSTOPERATIVE DIAGNOSES:  1. Autosomal dominant polycystic kidney disease.  2. Right renal calculus.  3. Right ureteral stricture.  4. Renal insufficiency.   PROCEDURE:  1. Cystoscopy.  2. Right retrograde pyelography.  3. Right ureteroscopy with stone removal.  4. Right ureteral brush biopsy.  5. Right ureteral stent placement (6 x 24).   SURGEON:  Dr. Heloise Purpura.   ANESTHESIA:  General.   COMPLICATIONS:  None.   INDICATIONS:  Ms. Stubbe is a 44 year old female who presented 2 weeks  ago with severe right-sided flank pain.  She does have autosomal  dominant polycystic kidney disease with chronic renal insufficiency and  was noted to have worsening renal function.  She was felt to most likely  have a distal right ureteral calculus based on her initial imaging.  She  was taken to the operating room that day and no ureteral stone was  identified.  However, on retrograde pyelography, she was noted to have a  questionable area representing a mid right ureteral stricture.  A  ureteral stent was able to be placed and her renal function subsequently  stabilized.  She returns today for further evaluation of a possible  right ureteral stricture after a 2-week period of passive stent  dilation.  Potential risks, complications, and alternative options were  discussed with the patient in detail.  Informed consent was obtained.   DESCRIPTION OF PROCEDURE:   The patient was taken to the operating room  and a general anesthetic was administered.  She was given preoperative  antibiotics, placed in the dorsal lithotomy position, and prepped and  draped in the usual sterile fashion.  Next a preoperative time-out was  performed.  Cystourethroscopy was then performed which demonstrated a  normal urethra and bladder except for a right ureteral stent.  The right  ureteral stent was then grasped and brought out to the urethral meatus.  A 0.038 Sensor guidewire was then inserted through the stent and up into  the renal pelvis.  A 6-French ureteral catheter was advanced over the  patient's wire into the distal ureter and the wire was removed.  Retrograde pyelogram was then performed which did not demonstrate any  obvious filling defects or very narrowed strictured areas within the  ureter.  The renal pelvis demonstrated no evidence of filling defects or  dilation although was slightly abnormal in appearance with a dilated  upper pole calix.  Based on the patient's history of a possible ureteral  abnormality, it was decided to perform ureteroscopy.  A second guidewire  was then  placed into the renal pelvis and the Olympus digital  ureteroscope was advanced over the wire and up into the renal pelvis.  The renal pelvis was then examined in its entirety and there were no  tumors for abnormalities along the lining of the renal collecting  system.  She was noted to have a few punctate stones within the kidney  and one sizable stone measuring approximately 4 mm.  It was decided to  remove the stone to prevent further problems in the future.  Therefore a  nitinol basket was used to grab the stone and was able to be removed.  A  wire was then replaced and the ureteroscope was then advanced back over  the wire again up into the renal pelvis.  At this point the entire renal  collecting system had been evaluated and appeared normal.  The  ureteroscope was then  withdrawn down the ureter and within the mid  ureter, although not significantly narrowed, there was an area that  seemed somewhat fibrotic and did correlate to the patient's strictured  area seen on prior retrograde pyelography.  A brush biopsy was therefore  performed and sent for cytologic analysis.  The remainder of the ureter  was then evaluated with a semi-rigid ureteroscope without evidence for  tumors or other abnormalities.  A 6 x 24 double-J ureteral stent was  then advanced over the wire and appropriately positioned under  fluoroscopic guidance with the wire removed.  The cystoscope was used to  confirm the distal curl.  A string tether was left on the stent for  later removal by the patient.  The patient's bladder was emptied.  She  tolerated the procedure well without complications.  She was able to be  awakened and transferred to recovery unit in satisfactory condition.      Heloise Purpura, MD  Electronically Signed     LB/MEDQ  D:  01/29/2008  T:  01/29/2008  Job:  [id number]

## 2010-12-29 NOTE — Op Note (Signed)
[name], Walker            ACCOUNT NO.:  [id number]   MEDICAL RECORD NO.:  [id number]          PATIENT TYPE:  AMB   LOCATION:  DAY                          FACILITY:  [facility]   PHYSICIAN:  Heloise Purpura, MD      DATE OF BIRTH:  [birth year]   DATE OF PROCEDURE:  01/12/2008  DATE OF DISCHARGE:                               OPERATIVE REPORT   PREOPERATIVE DIAGNOSES:  1. Autosomal dominant polycystic kidney disease.  2. Chronic kidney disease.  3. Right hydronephrosis.  4. Right ureteral calculus.   POSTOPERATIVE DIAGNOSES:  1. Abdominal dominant polycystic kidney disease.  2. Chronic kidney disease.  3. Right ureteral obstruction.   PROCEDURES:  1. Cystoscopy.  2. Right retrograde pyelography with interpretation.  3. Right ureteroscopy.  4. Right ureteral stent placement (6 x 24).   SURGEON:  Dr. Heloise Purpura.   ANESTHESIA:  General.   COMPLICATIONS:  None.   INTRAOPERATIVE FINDINGS:  Retrograde pyelography demonstrated no filling  defects in the distal ureter but did demonstrate a narrowed tortuous  area in the mid ureter above the level of the iliac vessels.  No obvious  filling defects were noted, and no filling defects were noted within the  renal collecting system.   INDICATION:  Catherine Walker is a 45 year old female with a history of  polycystic kidney disease.  She recently underwent a CT scan which  demonstrated right-sided hydronephrosis along with two calcifications  within the pelvis in the vicinity of the distal ureter, possibly  concerning for ureteral calculi.  She was managed expectantly and  underwent follow-up imaging today at her nephrologist's office.  This  demonstrated persistent hydronephrosis with increasing creatinine from  her baseline.  She was therefore sent over for further evaluation by Dr.  Vonita Moss.  She was also experiencing continued right flank pain.  It was  therefore decided to proceed with a definitive ureteroscopic procedure  and  possible stent placement.  Potential risks/complications and  alternative options to the above procedures were discussed with the  patient.  Informed consent was obtained.   DESCRIPTION OF PROCEDURE:  The patient was taken to the operating room,  and a general anesthetic was administered.  She was given preoperative  antibiotics, placed in the dorsal lithotomy position, and prepped and  draped in the usual sterile fashion.  Next, a preoperative time-out was  performed.  Cystourethroscopy was performed which demonstrated a normal  appearing bladder without evidence of any mucosal abnormalities or  bladder tumors.  No stones were seen within the bladder.  The ureteral  orifices were in the normal anatomic position and appeared normal  without edema or other abnormalities.  Attention then turned to the  right ureteral orifice which was intubated with a 6-French ureteral  catheter.  Contrast was injected which demonstrated findings as dictated  above.  Specifically, a tortuous and narrowed mid ureter was identified  for length of approximately 2-3 cm.  No distal filling defects or distal  ureteral calculi were obviously seen.  A 0.038 sensor guidewire was then  inserted up into the right renal pelvis without difficulty under  fluoroscopic guidance.  The semi-rigid ureteroscope was then advanced  next to the wire into the ureter.  The ureteroscope was able to be  advanced up to the level of the patient's abnormality seen on retrograde  pyelography.  No ureteral calculi were identified.  While no obvious  tumor, mass, or calculi were seen at this level, the area of supposed  obstruction could not be bypassed with the ureteroscope at this point.  The ureteroscope was therefore removed, and it was decided to place a  ureteral stent to treat the patient's presumed right ureteral  obstruction.  The wire was back loaded over the cystoscope, and a 6 x 24  double-J ureteral stent was advanced over the  wire using Seldinger  technique.  It was appropriately positioned under fluoroscopic and  cystoscopic guidance, and the wire was removed.  The patient's bladder  was then emptied.  A good curl was noted in the renal pelvis as well as  in the bladder, indicating that the stent was in appropriate position.  The patient appeared to tolerate the procedure well without  complications.  She was able to be awakened and transferred to the  recovery unit in satisfactory condition.      Heloise Purpura, MD  Electronically Signed     LB/MEDQ  D:  01/12/2008  T:  01/12/2008  Job:  [id number]

## 2011-01-06 ENCOUNTER — Other Ambulatory Visit (INDEPENDENT_AMBULATORY_CARE_PROVIDER_SITE_OTHER): Payer: BC Managed Care – PPO

## 2011-01-06 DIAGNOSIS — Z0000 Encounter for general adult medical examination without abnormal findings: Secondary | ICD-10-CM

## 2011-01-06 LAB — LDL CHOLESTEROL, DIRECT: Direct LDL: 141.7 mg/dL

## 2011-01-06 LAB — BASIC METABOLIC PANEL
BUN: 30 mg/dL — ABNORMAL HIGH (ref 6–23)
CO2: 31 mEq/L (ref 19–32)
Chloride: 101 mEq/L (ref 96–112)
Glucose, Bld: 97 mg/dL (ref 70–99)
Potassium: 2.9 mEq/L — ABNORMAL LOW (ref 3.5–5.1)
Sodium: 140 mEq/L (ref 135–145)

## 2011-01-06 LAB — CBC WITH DIFFERENTIAL/PLATELET
Basophils Absolute: 0 10*3/uL (ref 0.0–0.1)
HCT: 32.3 % — ABNORMAL LOW (ref 36.0–46.0)
Hemoglobin: 11.1 g/dL — ABNORMAL LOW (ref 12.0–15.0)
Lymphs Abs: 1.6 10*3/uL (ref 0.7–4.0)
MCHC: 34.2 g/dL (ref 30.0–36.0)
MCV: 100.4 fl — ABNORMAL HIGH (ref 78.0–100.0)
Monocytes Absolute: 0.3 10*3/uL (ref 0.1–1.0)
Neutro Abs: 2.8 10*3/uL (ref 1.4–7.7)
Platelets: 169 10*3/uL (ref 150.0–400.0)
RDW: 13.2 % (ref 11.5–14.6)

## 2011-01-06 LAB — POCT URINALYSIS DIPSTICK
Bilirubin, UA: NEGATIVE
Blood, UA: NEGATIVE
Glucose, UA: NEGATIVE
Nitrite, UA: NEGATIVE
Spec Grav, UA: 1.02
pH, UA: 5

## 2011-01-06 LAB — LIPID PANEL
Cholesterol: 229 mg/dL — ABNORMAL HIGH (ref 0–200)
Total CHOL/HDL Ratio: 3
Triglycerides: 63 mg/dL (ref 0.0–149.0)
VLDL: 12.6 mg/dL (ref 0.0–40.0)

## 2011-01-06 LAB — HEPATIC FUNCTION PANEL
ALT: 22 U/L (ref 0–35)
AST: 21 U/L (ref 0–37)
Albumin: 3.7 g/dL (ref 3.5–5.2)
Total Bilirubin: 0.5 mg/dL (ref 0.3–1.2)

## 2011-01-12 ENCOUNTER — Encounter: Payer: Self-pay | Admitting: Family Medicine

## 2011-01-13 ENCOUNTER — Ambulatory Visit (INDEPENDENT_AMBULATORY_CARE_PROVIDER_SITE_OTHER): Payer: BC Managed Care – PPO | Admitting: Family Medicine

## 2011-01-13 ENCOUNTER — Other Ambulatory Visit (HOSPITAL_COMMUNITY)
Admission: RE | Admit: 2011-01-13 | Discharge: 2011-01-13 | Disposition: A | Discharge status: Home / Self Care | Payer: BC Managed Care – PPO | Source: Ambulatory Visit | Attending: Family Medicine | Admitting: Family Medicine

## 2011-01-13 ENCOUNTER — Encounter: Payer: Self-pay | Admitting: Family Medicine

## 2011-01-13 DIAGNOSIS — Z0000 Encounter for general adult medical examination without abnormal findings: Secondary | ICD-10-CM

## 2011-01-13 DIAGNOSIS — Z01419 Encounter for gynecological examination (general) (routine) without abnormal findings: Secondary | ICD-10-CM | POA: Insufficient documentation

## 2011-01-13 DIAGNOSIS — F329 Major depressive disorder, single episode, unspecified: Secondary | ICD-10-CM

## 2011-01-13 DIAGNOSIS — Q613 Polycystic kidney, unspecified: Secondary | ICD-10-CM

## 2011-01-13 DIAGNOSIS — F3289 Other specified depressive episodes: Secondary | ICD-10-CM

## 2011-01-13 DIAGNOSIS — N259 Disorder resulting from impaired renal tubular function, unspecified: Secondary | ICD-10-CM

## 2011-01-13 MED ORDER — ACYCLOVIR 200 MG PO CAPS: 200.0000 mg | ORAL_CAPSULE | ORAL | Status: DC

## 2011-01-13 MED ORDER — TRIAMTERENE-HCTZ 75-50 MG PO TABS: ORAL_TABLET | ORAL | Status: DC

## 2011-01-13 MED ORDER — POTASSIUM CHLORIDE CRYS ER 20 MEQ PO TBCR: 20.0000 meq | EXTENDED_RELEASE_TABLET | Freq: Two times a day (BID) | ORAL | Status: DC

## 2011-01-13 MED ORDER — SERTRALINE HCL 100 MG PO TABS: 100.0000 mg | ORAL_TABLET | Freq: Every day | ORAL | Status: DC

## 2011-01-13 MED ORDER — SERTRALINE HCL 50 MG PO TABS: 50.0000 mg | ORAL_TABLET | Freq: Every day | ORAL | Status: DC

## 2011-01-13 NOTE — Progress Notes (Signed)
Addended by: Kern Reap B on: 01/13/2011 12:03 PM   Modules accepted: Orders

## 2011-01-13 NOTE — Progress Notes (Signed)
Subjective:    Patient ID: Catherine Walker, female    DOB: 01-Sep-1965, 45 y.o.   MRN: [id number]  HPI  Catherine Walker is a 45 year old single female, nonsmoker, who comes in today for evaluation of multiple issues.  She has a history of recurrent Hsv,,,,,,, for which he takes one 200 mg of Zovirax daily.  She has a history of depression, for which he takes Zoloft 150 mg nightly she seems emotionally well.  She has a history polycystic kidney disease with renal insufficiency.  She is currently followed not only by me, but her nephrologist.  The last nephrology visit showed hypokalemia.  She was advised to take potassium supplement and come back for follow-up.  Lab work.  She did not take her medicine, nor did she go back for follow-up.  She continues to take Maxzide 75 mg daily for swelling of her lower extremities.  I continue to advise her not to take the medication.  However, she says she has to take it.  She takes Ritalin 20 mg daily because of a history of anemia.  She was taking Luvox, however, she stopped that when she started the Zoloft.  Because of her renal insufficiency.  She has an anemia, for which the nephrologist is giving her a medication to stimulate red cell production.  She does not get routine eye care, however, she does get regular dental care.  She does not check her breasts monthly.  However, she had a mammogram in April, which was normal.  LMP 5/7, normal periods still monthly.      Review of Systems  Constitutional: Negative.   HENT: Negative.   Eyes: Negative.   Respiratory: Negative.   Cardiovascular: Negative.   Gastrointestinal: Negative.   Genitourinary: Negative.   Musculoskeletal: Negative.   Skin:       Multiple papules on her right and left lower extremities and upper arms, etiology unknown.  She is going back to the dermatologist, Dr. Yetta Barre today  Neurological: Negative.   Hematological: Negative.   Psychiatric/Behavioral: Negative.          Objective:   Physical Exam  Constitutional: She appears well-developed and well-nourished.  HENT:  Head: Normocephalic and atraumatic.  Right Ear: External ear normal.  Left Ear: External ear normal.  Nose: Nose normal.  Mouth/Throat: Oropharynx is clear and moist.  Eyes: EOM are normal. Pupils are equal, round, and reactive to light.  Neck: Normal range of motion. Neck supple. No thyromegaly present.  Cardiovascular: Normal rate, regular rhythm, normal heart sounds and intact distal pulses.  Exam reveals no gallop and no friction rub.   No murmur heard. Pulmonary/Chest: Effort normal and breath sounds normal.  Abdominal: Soft. Bowel sounds are normal. She exhibits no distension and no mass. There is no tenderness. There is no rebound.  Genitourinary: Vagina normal and uterus normal. Guaiac negative stool. No vaginal discharge found.       Bilateral breast exam normal  Musculoskeletal: Normal range of motion.  Lymphadenopathy:    She has no cervical adenopathy.  Neurological: She is alert. She has normal reflexes. No cranial nerve deficit. She exhibits normal muscle tone. Coordination normal.  Skin: Skin is warm and dry.       Multiple papules lower extremities and arms, etiology unknown  Psychiatric: She has a normal mood and affect. Her behavior is normal. Judgment and thought content normal.          Assessment & Plan:  History of HSV.  Continue daily, Zovirax.  History of bulimia, stop the Ritalin.  Follow-up in 3 weeks.  History depression.  Continue Zoloft 150 mg nightly  History of venous insufficiency, Maxzide, 75 -- 50 daily per patient request.  Hypokalemia.  Potassium now down to 2.9.  Recommend she take two tablets twice daily for 3 days, then one tablet daily.  Follow-up serum potassium in one week.  Polycystic kidneys with renal insufficiency.  Continue routine follow-up with her nephrologist, Dr. Raquel James

## 2011-01-13 NOTE — Patient Instructions (Signed)
Take two, potassium twice daily for 3 days, then one daily.  Follow-up serum potassium in one week.  Stop the Ritalin.  Follow-up with me in 3 weeks.  Continue the acyclovir daily.  Continue the Zoloft 150 mg daily.  I again would recommend he stop the Maxzide completely however lets try taking one tablet every other day instead of daily.  Remember to drink lots of water.

## 2011-01-18 ENCOUNTER — Telehealth: Payer: Self-pay | Admitting: Family Medicine

## 2011-01-18 DIAGNOSIS — N259 Disorder resulting from impaired renal tubular function, unspecified: Secondary | ICD-10-CM

## 2011-01-18 MED ORDER — POTASSIUM CHLORIDE CRYS ER 20 MEQ PO TBCR: 20.0000 meq | EXTENDED_RELEASE_TABLET | Freq: Two times a day (BID) | ORAL | Status: DC

## 2011-01-18 MED ORDER — PROMETHAZINE HCL 12.5 MG RE SUPP: 12.5000 mg | Freq: Four times a day (QID) | RECTAL | Status: AC | PRN

## 2011-01-18 NOTE — Telephone Encounter (Signed)
rx sent and patient is aware 

## 2011-01-18 NOTE — Telephone Encounter (Signed)
Pt called and has stomach virus. Can not keep anything liquid or solid down since 3 am today. Pt also has periodic diarrhea. Pt req med for nausea to be called in to [facility] on [address]. Pt is afraid of becoming dehydrated. Pt does not have a fever and no abdominal cramping. Pt does have body aches. Pls call in something asap today. N

## 2011-01-18 NOTE — Telephone Encounter (Signed)
Need Potassium refill also.

## 2011-01-21 ENCOUNTER — Telehealth: Payer: Self-pay | Admitting: Family Medicine

## 2011-01-21 ENCOUNTER — Encounter: Payer: Self-pay | Admitting: Family Medicine

## 2011-01-21 ENCOUNTER — Ambulatory Visit (INDEPENDENT_AMBULATORY_CARE_PROVIDER_SITE_OTHER): Payer: BC Managed Care – PPO | Admitting: Family Medicine

## 2011-01-21 VITALS — BP 122/86 | Temp 98.0°F

## 2011-01-21 DIAGNOSIS — N259 Disorder resulting from impaired renal tubular function, unspecified: Secondary | ICD-10-CM

## 2011-01-21 LAB — BASIC METABOLIC PANEL
Chloride: 98 mEq/L (ref 96–112)
GFR: 37.94 mL/min — ABNORMAL LOW (ref >60.00)
Potassium: 4.1 mEq/L (ref 3.5–5.1)
Sodium: 140 mEq/L (ref 135–145)

## 2011-01-21 NOTE — Telephone Encounter (Signed)
Left message on machine for patient

## 2011-01-21 NOTE — Telephone Encounter (Signed)
Triage vm-----------was just seen. She is to wean off her Ritalin. Should she follow up in 3 weeks?

## 2011-01-21 NOTE — Patient Instructions (Signed)
Continue the potassium tablets, one daily.  Take the Ritalin, one tablet Monday, Wednesday, Friday.  Continue the acyclovir.  Continue the Zoloft 100 mg daily.  Again, I would recommend that you stop the Maxzide completely however, a compromise would be to take one tablet Monday, Wednesday, Friday.  I will call you about your lab work tonight, and we will discuss follow-up

## 2011-01-21 NOTE — Progress Notes (Signed)
  Subjective:    Patient ID: Catherine Walker, female    DOB: Nov 22, 1965, 45 y.o.   MRN: [id number]  Catherine Walker is a 45 year old female, who comes in today for evaluation of renal insufficiency, and hypokalemia.  We saw her last week for general physical examination her potassium was low at 2.9.  We outlined the game plan of taking one potassium tablet twice daily for 3 days and then one daily.  She's here today for follow-up.  She also had an episode of nausea and vomiting.  Last Monday, but it only lasted about 12 hours.   We also discussed trying to stop the Ritalin by tapering at Monday, Wednesday, Friday, for 3 weeks.  We also again encouraged her to stop the Maxzide completely however, she declines to do that.  We compromised and should take one on Monday, Wednesday, Friday.      Review of Systems    General and metabolic review of systems otherwise negative Objective:   Physical Exam    Well-developed well-nourished, female, in no acute distress    Assessment & Plan:  Renal insufficiency, hypokalemia check labs

## 2011-01-21 NOTE — Telephone Encounter (Signed)
yes

## 2011-02-15 ENCOUNTER — Other Ambulatory Visit: Payer: Self-pay | Admitting: *Deleted

## 2011-02-15 ENCOUNTER — Ambulatory Visit (INDEPENDENT_AMBULATORY_CARE_PROVIDER_SITE_OTHER): Payer: BC Managed Care – PPO | Admitting: Family Medicine

## 2011-02-15 ENCOUNTER — Encounter: Payer: Self-pay | Admitting: Family Medicine

## 2011-02-15 DIAGNOSIS — F909 Attention-deficit hyperactivity disorder, unspecified type: Secondary | ICD-10-CM

## 2011-02-15 DIAGNOSIS — Q613 Polycystic kidney, unspecified: Secondary | ICD-10-CM

## 2011-02-15 DIAGNOSIS — N259 Disorder resulting from impaired renal tubular function, unspecified: Secondary | ICD-10-CM

## 2011-02-15 MED ORDER — METHYLPHENIDATE HCL 20 MG PO TABS: 20.0000 mg | ORAL_TABLET | Freq: Every day | ORAL | Status: DC

## 2011-02-15 NOTE — Patient Instructions (Signed)
Continue your current medications.  Remember to call two weeks from being out of your final prescription for refills on the Ritalin.  Follow-up with me May 2013 per complete physical exam, sooner if any problems

## 2011-02-15 NOTE — Progress Notes (Signed)
  Subjective:    Patient ID: LUBA MATZEN, female    DOB: 1966/06/15, 45 y.o.   MRN: [id number]  HPI  Placida is a delightful, 45 year old single female, who comes in today for follow-up of renal insufficiency.  She has a history of polycystic kidney disease and renal insufficiency.  She agreed to decrease her Maxzide to one tablet Monday, Wednesday, Friday.  This is caused a slight increase in energy are far from 45 to 37 and a drop in her creatinine from 45.2, down to 1.9.  She has an occasional issue with fluid retention on the weekend for which she would take an extra Maxzide.  We also started on Ritalin 10 mg Monday, Wednesday, Friday.  This helps with her curb her feelings for the eating disorder issue.  She is due to see the nephrologist in August.   Review of Systems    General and hematologic and renal review of systems otherwise negative Objective:   Physical Exam    Well-developed and nourished, thin, female, in no acute distress   Assessment & Plan:  Polycystic renal syndrome with renal insufficiency.  Follow-up with nephrology in the May 2013 sooner if any problems.  Continue the Adderall 20 mg Monday, Wednesday, Friday

## 2011-04-15 ENCOUNTER — Other Ambulatory Visit: Payer: Self-pay | Admitting: Dermatology

## 2011-05-12 ENCOUNTER — Other Ambulatory Visit: Payer: Self-pay | Admitting: Family Medicine

## 2011-05-12 DIAGNOSIS — F909 Attention-deficit hyperactivity disorder, unspecified type: Secondary | ICD-10-CM

## 2011-05-12 LAB — URINALYSIS, ROUTINE W REFLEX MICROSCOPIC
Nitrite: NEGATIVE
Protein, ur: NEGATIVE
Specific Gravity, Urine: 1.01
Urobilinogen, UA: 0.2

## 2011-05-12 LAB — URINE CULTURE

## 2011-05-12 LAB — HEMOGLOBIN AND HEMATOCRIT, BLOOD
HCT: 28.3 — ABNORMAL LOW
Hemoglobin: 9.8 — ABNORMAL LOW

## 2011-05-12 NOTE — Telephone Encounter (Signed)
Pt needs new rx ritalin 20 mg. °

## 2011-05-13 LAB — POCT I-STAT 4, (NA,K, GLUC, HGB,HCT)
HCT: 32 — ABNORMAL LOW
Hemoglobin: 10.9 — ABNORMAL LOW
Potassium: 3.6
Sodium: 139

## 2011-05-13 MED ORDER — METHYLPHENIDATE HCL 20 MG PO TABS
20.0000 mg | ORAL_TABLET | Freq: Every day | ORAL | Status: DC
Start: 2011-05-13 — End: 2011-08-16

## 2011-05-13 MED ORDER — METHYLPHENIDATE HCL 20 MG PO TABS: 20.0000 mg | ORAL_TABLET | Freq: Every day | ORAL | Status: DC

## 2011-05-13 NOTE — Telephone Encounter (Signed)
rx ready for pickup 

## 2011-05-18 ENCOUNTER — Ambulatory Visit (INDEPENDENT_AMBULATORY_CARE_PROVIDER_SITE_OTHER): Payer: BC Managed Care – PPO | Admitting: Family Medicine

## 2011-05-18 ENCOUNTER — Encounter: Payer: Self-pay | Admitting: Family Medicine

## 2011-05-18 VITALS — BP 100/70 | Temp 97.7°F

## 2011-05-18 DIAGNOSIS — N912 Amenorrhea, unspecified: Secondary | ICD-10-CM | POA: Insufficient documentation

## 2011-05-18 NOTE — Progress Notes (Signed)
  Subjective:    Patient ID: Catherine Walker, female    DOB: Apr 03, 1966, 45 y.o.   MRN: [id number]  HPI Catherine Walker is a 45 year old single female, nonsmoker, who comes in today for evaluation of amenorrhea.  She states for the past couple years.  Her periods have become irregular.  Her last .  Menstrual cycle was  may of this year.  The breast control.  She uses condoms.  She stands 6 home pregnancy tests all of which were negative.  She has no symptoms of being pregnant and  She does have a history of bleeding in ADD and chronic renal insufficiency.  This may have caused her to go through early menopause.  However, she has no symptoms of hot flashes, dry.  Skin dry vagina, et Karie Soda.  We discussed risks, options.  She Elects  to do nothing at this point in time.   Review of Systems    General and GYN review of systems otherwise negative Objective:   Physical Exam  Well-developed thin, female, in no acute distress.  Abdominal exam was negative      Assessment & Plan:  Perimenopausal recommended that watchful waiting.  She will call if she is severely symptomatic.  If not return in May for annual exam

## 2011-05-18 NOTE — Patient Instructions (Signed)
Return in May for your annual exam, sooner if any problems

## 2011-08-16 ENCOUNTER — Other Ambulatory Visit: Payer: Self-pay | Admitting: Family Medicine

## 2011-08-16 DIAGNOSIS — F909 Attention-deficit hyperactivity disorder, unspecified type: Secondary | ICD-10-CM

## 2011-08-16 MED ORDER — METHYLPHENIDATE HCL 20 MG PO TABS: 20.0000 mg | ORAL_TABLET | Freq: Every day | ORAL | Status: DC

## 2011-08-16 NOTE — Telephone Encounter (Signed)
rx ready for pick up and Left message on machine for patient 

## 2011-08-16 NOTE — Telephone Encounter (Signed)
Pt need new rx ritalin 20mg . Pt is out

## 2011-09-20 ENCOUNTER — Other Ambulatory Visit: Payer: Self-pay | Admitting: Dermatology

## 2011-11-08 ENCOUNTER — Telehealth: Payer: Self-pay | Admitting: Family Medicine

## 2011-11-08 DIAGNOSIS — F909 Attention-deficit hyperactivity disorder, unspecified type: Secondary | ICD-10-CM

## 2011-11-08 MED ORDER — METHYLPHENIDATE HCL 20 MG PO TABS: 20.0000 mg | ORAL_TABLET | Freq: Every day | ORAL | Status: DC

## 2011-11-08 NOTE — Telephone Encounter (Signed)
Patient called stating that she need a refill on her ritalin. Please assist.

## 2011-11-08 NOTE — Telephone Encounter (Signed)
Rx ready for pick up and patient is aware 

## 2011-12-03 ENCOUNTER — Other Ambulatory Visit: Payer: Self-pay | Admitting: Family Medicine

## 2011-12-03 DIAGNOSIS — Z1231 Encounter for screening mammogram for malignant neoplasm of breast: Secondary | ICD-10-CM

## 2011-12-16 ENCOUNTER — Ambulatory Visit
Admission: RE | Admit: 2011-12-16 | Discharge: 2011-12-16 | Disposition: A | Discharge status: Home / Self Care | Payer: BC Managed Care – PPO | Source: Ambulatory Visit | Attending: Family Medicine | Admitting: Family Medicine

## 2011-12-16 DIAGNOSIS — Z1231 Encounter for screening mammogram for malignant neoplasm of breast: Secondary | ICD-10-CM

## 2012-01-11 ENCOUNTER — Other Ambulatory Visit (INDEPENDENT_AMBULATORY_CARE_PROVIDER_SITE_OTHER): Payer: BC Managed Care – PPO

## 2012-01-11 DIAGNOSIS — Z0000 Encounter for general adult medical examination without abnormal findings: Secondary | ICD-10-CM

## 2012-01-11 DIAGNOSIS — N259 Disorder resulting from impaired renal tubular function, unspecified: Secondary | ICD-10-CM

## 2012-01-11 LAB — CBC WITH DIFFERENTIAL/PLATELET
Basophils Absolute: 0 10*3/uL (ref 0.0–0.1)
Basophils Relative: 0.7 % (ref 0.0–3.0)
Eosinophils Absolute: 0.1 10*3/uL (ref 0.0–0.7)
Lymphocytes Relative: 32.1 % (ref 12.0–46.0)
MCHC: 33.6 g/dL (ref 30.0–36.0)
Neutrophils Relative %: 57.6 % (ref 43.0–77.0)
Platelets: 192 10*3/uL (ref 150.0–400.0)
RBC: 3.14 Mil/uL — ABNORMAL LOW (ref 3.87–5.11)

## 2012-01-11 LAB — POCT URINALYSIS DIPSTICK
Bilirubin, UA: NEGATIVE
Glucose, UA: NEGATIVE
Ketones, UA: NEGATIVE
Spec Grav, UA: 1.01

## 2012-01-11 LAB — LIPID PANEL
Cholesterol: 208 mg/dL — ABNORMAL HIGH (ref 0–200)
HDL: 78.5 mg/dL (ref >39.00)
Total CHOL/HDL Ratio: 3
Triglycerides: 80 mg/dL (ref 0.0–149.0)
VLDL: 16 mg/dL (ref 0.0–40.0)

## 2012-01-11 LAB — HEPATIC FUNCTION PANEL
Alkaline Phosphatase: 34 U/L — ABNORMAL LOW (ref 39–117)
Bilirubin, Direct: 0 mg/dL (ref 0.0–0.3)
Total Bilirubin: 0.6 mg/dL (ref 0.3–1.2)
Total Protein: 7 g/dL (ref 6.0–8.3)

## 2012-01-11 LAB — BASIC METABOLIC PANEL
CO2: 28 mEq/L (ref 19–32)
Calcium: 9.3 mg/dL (ref 8.4–10.5)
Creatinine, Ser: 2.1 mg/dL — ABNORMAL HIGH (ref 0.4–1.2)

## 2012-01-17 ENCOUNTER — Encounter: Payer: Self-pay | Admitting: Family Medicine

## 2012-01-17 ENCOUNTER — Other Ambulatory Visit (HOSPITAL_COMMUNITY)
Admission: RE | Admit: 2012-01-17 | Discharge: 2012-01-17 | Disposition: A | Discharge status: Home / Self Care | Payer: BC Managed Care – PPO | Source: Ambulatory Visit | Attending: Family Medicine | Admitting: Family Medicine

## 2012-01-17 ENCOUNTER — Ambulatory Visit (INDEPENDENT_AMBULATORY_CARE_PROVIDER_SITE_OTHER): Payer: BC Managed Care – PPO | Admitting: Family Medicine

## 2012-01-17 VITALS — BP 110/80 | Temp 98.2°F | Ht 62.75 in | Wt 103.0 lb

## 2012-01-17 DIAGNOSIS — F329 Major depressive disorder, single episode, unspecified: Secondary | ICD-10-CM

## 2012-01-17 DIAGNOSIS — N259 Disorder resulting from impaired renal tubular function, unspecified: Secondary | ICD-10-CM

## 2012-01-17 DIAGNOSIS — F3289 Other specified depressive episodes: Secondary | ICD-10-CM

## 2012-01-17 DIAGNOSIS — B079 Viral wart, unspecified: Secondary | ICD-10-CM

## 2012-01-17 DIAGNOSIS — Z01419 Encounter for gynecological examination (general) (routine) without abnormal findings: Secondary | ICD-10-CM | POA: Insufficient documentation

## 2012-01-17 DIAGNOSIS — Q613 Polycystic kidney, unspecified: Secondary | ICD-10-CM

## 2012-01-17 DIAGNOSIS — F988 Other specified behavioral and emotional disorders with onset usually occurring in childhood and adolescence: Secondary | ICD-10-CM

## 2012-01-17 DIAGNOSIS — Z0000 Encounter for general adult medical examination without abnormal findings: Secondary | ICD-10-CM

## 2012-01-17 DIAGNOSIS — N912 Amenorrhea, unspecified: Secondary | ICD-10-CM

## 2012-01-17 DIAGNOSIS — B009 Herpesviral infection, unspecified: Secondary | ICD-10-CM

## 2012-01-17 MED ORDER — METHYLPHENIDATE HCL 20 MG PO TABS: 20.0000 mg | ORAL_TABLET | Freq: Every day | ORAL | Status: DC

## 2012-01-17 MED ORDER — ACYCLOVIR 200 MG PO CAPS: 200.0000 mg | ORAL_CAPSULE | ORAL | Status: DC

## 2012-01-17 MED ORDER — SERTRALINE HCL 50 MG PO TABS: 50.0000 mg | ORAL_TABLET | Freq: Every day | ORAL | Status: DC

## 2012-01-17 MED ORDER — POTASSIUM CHLORIDE CRYS ER 20 MEQ PO TBCR: 20.0000 meq | EXTENDED_RELEASE_TABLET | Freq: Two times a day (BID) | ORAL | Status: DC

## 2012-01-17 NOTE — Patient Instructions (Signed)
Continue current medications except stop the Maxzide  Call Dr. Nigel Berthold. and see him sometime this month  General medical exam in one year sooner if any problems  Always call 2 weeks from being out of your third Ritalin prescription for refills

## 2012-01-17 NOTE — Progress Notes (Signed)
  Subjective:    Patient ID: Catherine Walker, female    DOB: [birth year], 46 y.o.   MRN: [id number]  HPI Catherine Asp is a 46 year old single female nonsmoker who comes in today for general physical examination  She takes 200 mg of acyclovir daily because of a history of HPV  She takes Ritalin 20 mg daily for ADD  She takes Zoloft 50 mg each bedtime for depression states she's feeling well  She takes a potassium supplement because of a history of hypokalemia  She also takes Maxzide however advised to stop it because of declining renal function her GFR is down to 26. She is due to go back to see Dr. Raquel James her nephrologist sometime soon  She still gets a lot of sun exposure however she says she stopped going to the tanning booth. She has a history of basal cells and squamous cell carcinomas and has a new lesion on her hand she would like me to check Dr. Yetta Barre is her dermatologist   Review of Systems  Constitutional: Negative.   HENT: Negative.   Eyes: Negative.   Respiratory: Negative.   Cardiovascular: Negative.   Gastrointestinal: Negative.   Genitourinary: Negative.        Periods are very irregular she's been getting hot flushes and skipping  Musculoskeletal: Negative.   Neurological: Negative.   Hematological: Negative.   Psychiatric/Behavioral: Negative.        Objective:   Physical Exam  Constitutional: She appears well-developed and well-nourished.  HENT:  Head: Normocephalic and atraumatic.  Right Ear: External ear normal.  Left Ear: External ear normal.  Nose: Nose normal.  Mouth/Throat: Oropharynx is clear and moist.  Eyes: EOM are normal. Pupils are equal, round, and reactive to light.  Neck: Normal range of motion. Neck supple. No thyromegaly present.  Cardiovascular: Normal rate, regular rhythm, normal heart sounds and intact distal pulses.  Exam reveals no gallop and no friction rub.   No murmur heard. Pulmonary/Chest: Effort normal and breath sounds normal.    Abdominal: Soft. Bowel sounds are normal. She exhibits no distension and no mass. There is no tenderness. There is no rebound.  Genitourinary: Vagina normal and uterus normal. Guaiac negative stool. No vaginal discharge found.  Musculoskeletal: Normal range of motion.  Lymphadenopathy:    She has no cervical adenopathy.  Neurological: She is alert. She has normal reflexes. No cranial nerve deficit. She exhibits normal muscle tone. Coordination normal.  Skin: Skin is warm and dry.       Total body skin exam shows she is 10 total body numerous scars from lesions being removed the lesion on her right hand that appears to be a wart,,,,,,,,, it was treated with cryo-therapy,  Psychiatric: She has a normal mood and affect. Her behavior is normal. Judgment and thought content normal.          Assessment & Plan:  Healthy female  History polycystic kidney disease followed by Dr Raquel James  ASAP  Hypokalemia take potassium daily stop diuretic follow potassium level in one week  History of depression continue Zoloft 50 mg daily  History of HSV continue acyclovir at term milligrams daily  Adult ADD continue Ritalin 20 mg daily

## 2012-05-10 ENCOUNTER — Telehealth: Payer: Self-pay | Admitting: Family Medicine

## 2012-05-10 DIAGNOSIS — F988 Other specified behavioral and emotional disorders with onset usually occurring in childhood and adolescence: Secondary | ICD-10-CM

## 2012-05-10 NOTE — Telephone Encounter (Signed)
Patient called stating that she need a refill of her ritalin. Please assist.  °

## 2012-05-11 MED ORDER — METHYLPHENIDATE HCL 20 MG PO TABS: 20.0000 mg | ORAL_TABLET | Freq: Every day | ORAL | Status: DC

## 2012-05-11 NOTE — Telephone Encounter (Signed)
rx ready for pick up and patient is aware  

## 2012-05-15 ENCOUNTER — Ambulatory Visit (INDEPENDENT_AMBULATORY_CARE_PROVIDER_SITE_OTHER): Payer: BC Managed Care – PPO | Admitting: Family Medicine

## 2012-05-15 ENCOUNTER — Encounter: Payer: Self-pay | Admitting: Family Medicine

## 2012-05-15 VITALS — BP 120/84

## 2012-05-15 DIAGNOSIS — N259 Disorder resulting from impaired renal tubular function, unspecified: Secondary | ICD-10-CM

## 2012-05-15 LAB — BASIC METABOLIC PANEL
BUN: 31 mg/dL — ABNORMAL HIGH (ref 6–23)
Creatinine, Ser: 1.8 mg/dL — ABNORMAL HIGH (ref 0.4–1.2)
GFR: 33.28 mL/min — ABNORMAL LOW (ref >60.00)
Glucose, Bld: 84 mg/dL (ref 70–99)

## 2012-05-15 MED ORDER — TRIAMTERENE-HCTZ 37.5-25 MG PO CAPS: ORAL_CAPSULE | ORAL | Status: DC

## 2012-05-15 NOTE — Progress Notes (Signed)
  Subjective:    Patient ID: Catherine Walker, female    DOB: [birth year], 46 y.o.   MRN: [id number]  HPI Catherine Asp is a 46 year old single female nonsmoker who comes in today for evaluation of peripheral edema  We were able to gradually wean her off her Maxzide and in June I wrote her no more medicine. She saved some Maxide and would take a half a tablet 3 times a week however about a month ago she ran out of Maxzide completely and had no peripheral edema until last Thursday when she woke up her feet were swollen. She comes in today for evaluation. Dr. Raquel James and I have been rather firm about not taking diuretics since her GFR is so low   Review of Systems General and vascular review of systems otherwise negative    Objective:   Physical Exam  Well-developed alert female no acute distress examination of the leg shows normal appearance trace edema lower extremities peripheral pulses normal popliteals femorals all normal abdomen normal      Assessment & Plan:  Peripheral edema trace Dyazide one half tablet when necessary

## 2012-05-15 NOTE — Patient Instructions (Addendum)
Remember to stay away from salt  Maxide one half tab when necessary  Labs today to assess renal function

## 2012-08-03 ENCOUNTER — Other Ambulatory Visit: Payer: Self-pay | Admitting: Family Medicine

## 2012-08-03 DIAGNOSIS — F988 Other specified behavioral and emotional disorders with onset usually occurring in childhood and adolescence: Secondary | ICD-10-CM

## 2012-08-03 NOTE — Telephone Encounter (Signed)
Pt needs new rx generic ritalin 20mg . Pt is due on 08-10-2012

## 2012-08-10 MED ORDER — METHYLPHENIDATE HCL 20 MG PO TABS: 20.0000 mg | ORAL_TABLET | Freq: Every day | ORAL | Status: DC

## 2012-10-26 ENCOUNTER — Other Ambulatory Visit: Payer: Self-pay | Admitting: *Deleted

## 2012-10-26 MED ORDER — SERTRALINE HCL 50 MG PO TABS: 50.0000 mg | ORAL_TABLET | Freq: Every day | ORAL | Status: DC

## 2012-11-13 ENCOUNTER — Telehealth: Payer: Self-pay | Admitting: Family Medicine

## 2012-11-13 DIAGNOSIS — I10 Essential (primary) hypertension: Secondary | ICD-10-CM

## 2012-11-13 DIAGNOSIS — F988 Other specified behavioral and emotional disorders with onset usually occurring in childhood and adolescence: Secondary | ICD-10-CM

## 2012-11-13 DIAGNOSIS — N259 Disorder resulting from impaired renal tubular function, unspecified: Secondary | ICD-10-CM

## 2012-11-13 MED ORDER — METHYLPHENIDATE HCL 20 MG PO TABS: 20.0000 mg | ORAL_TABLET | Freq: Every day | ORAL | Status: DC

## 2012-11-13 MED ORDER — TRIAMTERENE-HCTZ 37.5-25 MG PO CAPS: ORAL_CAPSULE | ORAL | Status: DC

## 2012-11-13 NOTE — Telephone Encounter (Signed)
Pt called and requested a refill of her methylphenidate (RITALIN) 20 MG tablet, 1 pobid. She also needs a refill of her triamterene-hydrochlorothiazide (MAXZIDE) 37.5-25 MG per tablet 1/2 tablet by mouth every morning when necessary. Please assist.

## 2012-11-13 NOTE — Telephone Encounter (Signed)
Rx ready for pick up and patient is aware 

## 2012-11-24 ENCOUNTER — Other Ambulatory Visit: Payer: Self-pay

## 2012-11-24 DIAGNOSIS — Z1231 Encounter for screening mammogram for malignant neoplasm of breast: Secondary | ICD-10-CM

## 2012-12-19 ENCOUNTER — Ambulatory Visit
Admission: RE | Admit: 2012-12-19 | Discharge: 2012-12-19 | Disposition: A | Discharge status: Home / Self Care | Payer: BC Managed Care – PPO | Source: Ambulatory Visit

## 2012-12-19 DIAGNOSIS — Z1231 Encounter for screening mammogram for malignant neoplasm of breast: Secondary | ICD-10-CM

## 2013-01-18 ENCOUNTER — Other Ambulatory Visit: Payer: BC Managed Care – PPO

## 2013-01-20 ENCOUNTER — Other Ambulatory Visit: Payer: Self-pay | Admitting: Family Medicine

## 2013-01-24 ENCOUNTER — Encounter: Payer: BC Managed Care – PPO | Admitting: Family Medicine

## 2013-02-12 ENCOUNTER — Telehealth: Payer: Self-pay | Admitting: Family Medicine

## 2013-02-12 DIAGNOSIS — F988 Other specified behavioral and emotional disorders with onset usually occurring in childhood and adolescence: Secondary | ICD-10-CM

## 2013-02-12 MED ORDER — METHYLPHENIDATE HCL 20 MG PO TABS: 20.0000 mg | ORAL_TABLET | Freq: Every day | ORAL | Status: DC

## 2013-02-12 NOTE — Telephone Encounter (Signed)
Pt needs new rx ritalin 20 mg. °

## 2013-02-12 NOTE — Telephone Encounter (Signed)
rx ready for pick up and left message on machine 

## 2013-03-07 ENCOUNTER — Other Ambulatory Visit (INDEPENDENT_AMBULATORY_CARE_PROVIDER_SITE_OTHER): Payer: BC Managed Care – PPO

## 2013-03-07 DIAGNOSIS — Z0000 Encounter for general adult medical examination without abnormal findings: Secondary | ICD-10-CM

## 2013-03-07 LAB — TSH: TSH: 3.12 u[IU]/mL (ref 0.35–5.50)

## 2013-03-07 LAB — CBC WITH DIFFERENTIAL/PLATELET
Basophils Absolute: 0 10*3/uL (ref 0.0–0.1)
Eosinophils Absolute: 0.1 10*3/uL (ref 0.0–0.7)
HCT: 30.6 % — ABNORMAL LOW (ref 36.0–46.0)
Hemoglobin: 10.4 g/dL — ABNORMAL LOW (ref 12.0–15.0)
Lymphs Abs: 1.4 10*3/uL (ref 0.7–4.0)
MCHC: 33.8 g/dL (ref 30.0–36.0)
MCV: 97.7 fl (ref 78.0–100.0)
Monocytes Absolute: 0.2 10*3/uL (ref 0.1–1.0)
Neutro Abs: 2.6 10*3/uL (ref 1.4–7.7)
Platelets: 209 10*3/uL (ref 150.0–400.0)
RDW: 12.9 % (ref 11.5–14.6)

## 2013-03-07 LAB — HEPATIC FUNCTION PANEL
AST: 17 U/L (ref 0–37)
Albumin: 3.7 g/dL (ref 3.5–5.2)
Alkaline Phosphatase: 40 U/L (ref 39–117)
Bilirubin, Direct: 0 mg/dL (ref 0.0–0.3)

## 2013-03-07 LAB — BASIC METABOLIC PANEL
Calcium: 10.1 mg/dL (ref 8.4–10.5)
Creatinine, Ser: 2 mg/dL — ABNORMAL HIGH (ref 0.4–1.2)
GFR: 28.1 mL/min — ABNORMAL LOW (ref >60.00)
Sodium: 137 mEq/L (ref 135–145)

## 2013-03-07 LAB — POCT URINALYSIS DIPSTICK
Bilirubin, UA: NEGATIVE
Ketones, UA: NEGATIVE
Leukocytes, UA: NEGATIVE
Protein, UA: NEGATIVE
Spec Grav, UA: 1.015
pH, UA: 5

## 2013-03-07 LAB — LIPID PANEL
HDL: 77.2 mg/dL (ref >39.00)
Triglycerides: 70 mg/dL (ref 0.0–149.0)

## 2013-03-13 ENCOUNTER — Ambulatory Visit (INDEPENDENT_AMBULATORY_CARE_PROVIDER_SITE_OTHER): Payer: BC Managed Care – PPO | Admitting: Family Medicine

## 2013-03-13 ENCOUNTER — Encounter: Payer: Self-pay | Admitting: Family Medicine

## 2013-03-13 ENCOUNTER — Other Ambulatory Visit (HOSPITAL_COMMUNITY)
Admission: RE | Admit: 2013-03-13 | Discharge: 2013-03-13 | Disposition: A | Discharge status: Home / Self Care | Payer: BC Managed Care – PPO | Source: Ambulatory Visit | Attending: Family Medicine | Admitting: Family Medicine

## 2013-03-13 ENCOUNTER — Encounter: Payer: BC Managed Care – PPO | Admitting: Family Medicine

## 2013-03-13 VITALS — BP 120/76 | Temp 98.3°F | Ht 63.25 in | Wt 112.0 lb

## 2013-03-13 DIAGNOSIS — E785 Hyperlipidemia, unspecified: Secondary | ICD-10-CM

## 2013-03-13 DIAGNOSIS — N259 Disorder resulting from impaired renal tubular function, unspecified: Secondary | ICD-10-CM

## 2013-03-13 DIAGNOSIS — F329 Major depressive disorder, single episode, unspecified: Secondary | ICD-10-CM

## 2013-03-13 DIAGNOSIS — Z0000 Encounter for general adult medical examination without abnormal findings: Secondary | ICD-10-CM

## 2013-03-13 DIAGNOSIS — Z01419 Encounter for gynecological examination (general) (routine) without abnormal findings: Secondary | ICD-10-CM | POA: Insufficient documentation

## 2013-03-13 DIAGNOSIS — Z1151 Encounter for screening for human papillomavirus (HPV): Secondary | ICD-10-CM | POA: Insufficient documentation

## 2013-03-13 NOTE — Patient Instructions (Addendum)
-  use sunscreen/se dermatologist  -We have ordered labs or studies at this visit (pap smear). It can take up to 1-2 weeks for results and processing. We will contact you with instructions IF your results are abnormal. Normal results will be released to your [facility]. If you have not heard from [place] or can not find your results in [facility] in 2 weeks please contact our office.  -follow up in 3 months with your doctor to recheck cholesterol/discuss  -follow up this month with your kidney doctor

## 2013-03-13 NOTE — Progress Notes (Signed)
Chief Complaint  Patient presents with  . Annual Exam    HPI:  Here for CPE:  -Concerns today: 47 yo F pt of Dr. Deatra Ina here for physical exam today.  1)History of HPV: -takes acyclovir daily, 200mg  per PCP notes  2)ADD: -followed by PCP on Ritalin 20mg  daily -stable  3)Depression: -on zoloft 50mg  daily per PCP -stable  4)CRI/hypokalemia: -followed by nephrology -reports has appointment with nephrology this month  -Diet: variety of foods, balance and well rounded  -Taking folic acid: no  -Exercise: runs daily  3-5 daily  -Diabetes and Dyslipidemia Screening: fasting labs - looks like PCP already called her about these - elevated LDL  -Hx of HTN: no  -Vaccines: UTD  -pap history: 1 year ago - normal, all have been normal - but wants to get yearly  -FDLMP: postmenopausal, periods stopped over 1 year ago, no bleeding since  -sexual activity: yes, female partner, no new partners  -wants STI testing: no  -FH breast, colon or ovarian ca: see FH  -Alcohol, Tobacco, drug use: see social history -had mammo and normal  Review of Systems - Denies: CP, SOB, DOE, change in bowels, no bleeding, no vag symptoms, no dysuria  Past Medical History  Diagnosis Date  . DEPRESSION 12/04/2007  . RENAL INSUFFICIENCY 12/12/2007  . ITBS, LEFT KNEE 09/06/2007  . POLYCYSTIC KIDNEY DISEASE 12/13/2008  . Urinary frequency 12/04/2007  . FLANK PAIN, LEFT 12/02/2007  . BULIMIA, HX OF 12/12/2007  . 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 Kidney disease Father     polycystic, on the transplant disease    History   Social History  . Marital Status: Single    Spouse Name: N/A    Number of Children: N/A  . Years of Education: N/A   Social History Main Topics  . Smoking status: Never Smoker   . Smokeless tobacco: None  . Alcohol Use:   . Drug Use:   . Sexually Active:    Other Topics Concern  . None   Social History Narrative  .  None    Current outpatient prescriptions:acyclovir (ZOVIRAX) 200 MG capsule, TAKE ONE CAPSULE BY MOUTH DAILY AS DIRECTED, Disp: 100 capsule, Rfl: 2;  methylphenidate (RITALIN) 20 MG tablet, Take 1 tablet (20 mg total) by mouth daily., Disp: 30 tablet, Rfl: 0;  methylphenidate (RITALIN) 20 MG tablet, Take 1 tablet (20 mg total) by mouth daily., Disp: 30 tablet, Rfl: 0 methylphenidate (RITALIN) 20 MG tablet, Take 1 tablet (20 mg total) by mouth daily., Disp: 30 tablet, Rfl: 0;  potassium chloride SA (KLOR-CON M20) 20 MEQ tablet, Take 1 tablet (20 mEq total) by mouth 2 (two) times daily., Disp: 200 tablet, Rfl: 3;  potassium citrate (UROCIT-K) 5 MEQ (540 MG) SR tablet, , Disp: , Rfl: ;  sertraline (ZOLOFT) 50 MG tablet, Take 1 tablet (50 mg total) by mouth at bedtime., Disp: 100 tablet, Rfl: 3 triamterene-hydrochlorothiazide (DYAZIDE) 37.5-25 MG per capsule, One half every morning when necessary, Disp: 90 capsule, Rfl: 2;  imiquimod (ALDARA) 5 % cream, , Disp: , Rfl: ;  Psyllium (METAMUCIL) 30.9 % POWD, Take by mouth at bedtime.  , Disp: , Rfl: ;  triamterene-hydrochlorothiazide (MAXZIDE) 75-50 MG per tablet, One tablet Monday, Wednesday, Friday, Disp: 100 tablet, Rfl: 3  EXAM:  Filed Vitals:   03/13/13 1436  BP: 120/76  Temp: 98.3 F (36.8 C)    GENERAL: vitals reviewed and listed below, alert, oriented,  appears well hydrated and in no acute distress  HEENT: head atraumatic, PERRLA, normal appearance of eyes, ears, nose and mouth. moist mucus membranes.  NECK: supple, no masses or lymphadenopathy  LUNGS: clear to auscultation bilaterally, no rales, rhonchi or wheeze  CV: HRRR, no peripheral edema or cyanosis, normal pedal pulses  BREAST: normal appearance - no lesions or discharge, on palpation normal breast tissue without any suspicious masses  ABDOMEN: bowel sounds normal, soft, non tender to palpation, no masses, no rebound or guarding  GU: normal appearance of external genitalia - no  lesions or masses, normal vaginal mucosa - no abnormal discharge, normal appearance of cervix - no lesions or abnormal discharge, no masses or tenderness on palpation of uterus and ovaries.  RECTAL: deferred  SKIN: no rash or abnormal lesions - lots of freckles, tanned dry skin  MS: normal gait, moves all extremities normally  NEURO: CN II-XII grossly intact, normal muscle strength and sensation to light touch on extremities  PSYCH: normal affect, pleasant and cooperative  ASSESSMENT AND PLAN:  Discussed the following assessment and plan:  Encounter for preventive health examination -reviewed labs -pap obtained -advised usn protection and she has follow up with derm  DEPRESSION -stable  RENAL INSUFFICIENCY -seeing nephrology  Hyperlipemia -discussed, she prefers to hold of on tx for now and follow up with PCP in a few months to recheck  -Discussed and advised all [place] preventive services health task force level A and B recommendations for age, sex and risks.  -Advised at least 150 minutes of exercise per week and a healthy diet low in saturated fats and sweets and consisting of fresh fruits and vegetables, lean meats such as fish and white chicken and whole grains.  -labs, studies and vaccines per orders this encounter  No orders of the defined types were placed in this encounter.    Patient Instructions  -use sunscreen/se dermatologist  -We have ordered labs or studies at this visit (pap smear). It can take up to 1-2 weeks for results and processing. We will contact you with instructions IF your results are abnormal. Normal results will be released to your [facility]. If you have not heard from [place] or can not find your results in [facility] in 2 weeks please contact our office.  -follow up in 3 months with your doctor to recheck cholesterol/discuss  -follow up this month with your kidney doctor             Patient advised to return to clinic immediately if symptoms  worsen or persist or new concerns.   No Follow-up on file.  Kriste Basque R.

## 2013-03-16 NOTE — Progress Notes (Signed)
Quick Note:  Left a detailed message for pt at personalized cell phone number. ______ 

## 2013-04-12 ENCOUNTER — Telehealth: Payer: Self-pay | Admitting: Family Medicine

## 2013-04-12 NOTE — Telephone Encounter (Signed)
Pt states that she is going out of town tomorrow. She tried to fill her RX for Ritalin today, 8/28, but it is not due to be filled until 8/30. The pharmacy will not fill it early, and she is asking that we call and give them permission. Her pharmacy is [facility] on [address]. Please assist.

## 2013-04-12 NOTE — Telephone Encounter (Signed)
Okay per Dr Tawanna Cooler. Spoke to pharmacy and patient.

## 2013-05-08 ENCOUNTER — Telehealth: Payer: Self-pay | Admitting: Family Medicine

## 2013-05-08 DIAGNOSIS — F988 Other specified behavioral and emotional disorders with onset usually occurring in childhood and adolescence: Secondary | ICD-10-CM

## 2013-05-08 NOTE — Telephone Encounter (Signed)
Pt request refill of methylphenidate (RITALIN) 20 MG tablet  3 mo supply Pt going out of town and needs to p/u early. Pt would like to p/u Friday if possible.  Pt states if you could put "fill in 30 days/60 days) when you put fill in one mo., if a mo has 31 days it messes things up)

## 2013-05-11 MED ORDER — METHYLPHENIDATE HCL 20 MG PO TABS: 20.0000 mg | ORAL_TABLET | Freq: Every day | ORAL | Status: DC

## 2013-05-11 NOTE — Telephone Encounter (Signed)
rx ready for pick up and Left message on machine for patient 

## 2013-06-20 ENCOUNTER — Telehealth: Payer: Self-pay | Admitting: Family Medicine

## 2013-06-20 ENCOUNTER — Other Ambulatory Visit (INDEPENDENT_AMBULATORY_CARE_PROVIDER_SITE_OTHER): Payer: BC Managed Care – PPO

## 2013-06-20 DIAGNOSIS — E876 Hypokalemia: Secondary | ICD-10-CM

## 2013-06-20 NOTE — Telephone Encounter (Signed)
Patient Information:  Caller Name: Evamae  Phone: [phone]  Patient: Catherine Walker, Catherine Walker  Gender: Female  DOB: 1965-10-09  Age: 47 Years  PCP: Kelle Darting [facility])  Pregnant: No  Office Follow Up:  Does the office need to follow up with this patient?: Yes  Instructions For The Office: Would like Dr Tawanna Cooler to advise if needs lab or should be seen.  RN Note:  LMP 1.5 yrs ago. Recent > cholesterol 8/14.  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 When asked, admitted to chest tightness present for first 15 minutes when begins to run in cold morning air that subsides.  Declined appointment for 06/20/13 then declined to see another provider other than Dr Tawanna Cooler.  Would like Dr Tawanna Cooler to advise follow up.    Symptoms  Reason For Call & Symptoms: Mild fatigue.  Called to request lab tests be ordered.   History of hypokalemia 8/14.  Preparing for 13 mile (half) marathon 07/01/13. Does not have Rx for potassium.  Noted > cramping in toes at night.  Reviewed Health History In EMR: Yes  Reviewed Medications In EMR: Yes  Reviewed Allergies In EMR: Yes  Reviewed Surgeries / Procedures: Yes  Date of Onset of Symptoms: 04/23/2013 OB / GYN:  LMP: Unknown  Guideline(s) Used:  Weakness (Generalized) and Fatigue  Disposition Per Guideline:   See Today in Office  Reason For Disposition Reached:   Taking a medicine that could cause weakness (e.g., blood pressure medications, diuretics)  Advice Given:  Call Back If:  Unable to stand or walk  Passes out  Breathing difficulty occurs  You become worse.  RN Overrode Recommendation:  Follow Up With Office Later  Declined to see another provider other than Dr Tawanna Cooler.  Would like Dr Tawanna Cooler to advise if can come in for lab work or what is recommended based on last potassium level.

## 2013-06-20 NOTE — Telephone Encounter (Unsigned)
Caller: Catherine Walker; PCP: Kelle Darting (Family Practice); CB#: [phone]; Call regarding critical potassium lab value. Catherine Walker is calling from [employer] regarding a Potassium ordered on Catherine Walker by Kelle Darting [facility]). Critical Potassium (2.8). RN/CAN called Dr. Darrick Huntsman who ordered  kcl po (60 tablets-dispense) q 6 hours starting tonight with a meal, one in the am and one in the lunch, needs 3 doses in the next 24 hours and to touch base with her primary and repeat potassium level check on Friday 06/22/2013. Marland Kitchen RN/CAN left message on her answering machine and stated lab results and require action tonight and to please call back.

## 2013-06-20 NOTE — Telephone Encounter (Signed)
Spoke with patient and lab appointment made. 

## 2013-06-21 ENCOUNTER — Other Ambulatory Visit: Payer: Self-pay | Admitting: Family Medicine

## 2013-06-21 ENCOUNTER — Other Ambulatory Visit: Payer: Self-pay | Admitting: *Deleted

## 2013-06-21 DIAGNOSIS — N259 Disorder resulting from impaired renal tubular function, unspecified: Secondary | ICD-10-CM

## 2013-06-21 DIAGNOSIS — E876 Hypokalemia: Secondary | ICD-10-CM

## 2013-06-21 MED ORDER — POTASSIUM CHLORIDE CRYS ER 20 MEQ PO TBCR: EXTENDED_RELEASE_TABLET | ORAL | Status: DC

## 2013-06-21 NOTE — Telephone Encounter (Signed)
Rx sent to pharmacy and lab appointment made

## 2013-06-22 ENCOUNTER — Other Ambulatory Visit (INDEPENDENT_AMBULATORY_CARE_PROVIDER_SITE_OTHER): Payer: BC Managed Care – PPO

## 2013-06-22 DIAGNOSIS — E876 Hypokalemia: Secondary | ICD-10-CM

## 2013-06-22 LAB — POTASSIUM: Potassium: 4.4 mEq/L (ref 3.5–5.1)

## 2013-06-25 ENCOUNTER — Other Ambulatory Visit: Payer: BC Managed Care – PPO

## 2013-07-21 ENCOUNTER — Other Ambulatory Visit: Payer: Self-pay | Admitting: Family Medicine

## 2013-08-14 ENCOUNTER — Telehealth: Payer: Self-pay | Admitting: Family Medicine

## 2013-08-14 DIAGNOSIS — F988 Other specified behavioral and emotional disorders with onset usually occurring in childhood and adolescence: Secondary | ICD-10-CM

## 2013-08-14 MED ORDER — METHYLPHENIDATE HCL 20 MG PO TABS: 20.0000 mg | ORAL_TABLET | Freq: Every day | ORAL | Status: DC

## 2013-08-14 NOTE — Telephone Encounter (Signed)
Pt has 1 day left on her medication and needs a re-fill of methylphenidate (RITALIN) 20 MG tablet.

## 2013-08-14 NOTE — Telephone Encounter (Signed)
Rx ready for pick up and patient is aware 

## 2013-10-10 ENCOUNTER — Other Ambulatory Visit: Payer: Self-pay | Admitting: Dermatology

## 2013-10-20 ENCOUNTER — Other Ambulatory Visit: Payer: Self-pay | Admitting: Family Medicine

## 2013-10-26 ENCOUNTER — Other Ambulatory Visit: Payer: Self-pay | Admitting: Family Medicine

## 2013-11-12 ENCOUNTER — Telehealth: Payer: Self-pay | Admitting: Family Medicine

## 2013-11-12 DIAGNOSIS — F988 Other specified behavioral and emotional disorders with onset usually occurring in childhood and adolescence: Secondary | ICD-10-CM

## 2013-11-12 MED ORDER — METHYLPHENIDATE HCL 20 MG PO TABS: 20.0000 mg | ORAL_TABLET | Freq: Every day | ORAL | Status: DC

## 2013-11-12 MED ORDER — METHYLPHENIDATE HCL 20 MG PO TABS
20.0000 mg | ORAL_TABLET | Freq: Every day | ORAL | Status: DC
Start: 2013-11-12 — End: 2014-03-21

## 2013-11-12 NOTE — Telephone Encounter (Signed)
Rx ready for pick up and patient is aware 

## 2013-11-12 NOTE — Telephone Encounter (Signed)
Pt is needing new rx for methylphenidate (RITALIN) 20 MG tablet, pt states she needs the rx this evening because she is leaving to go out of town in the morning. Please call when available for pick up.

## 2013-11-28 ENCOUNTER — Other Ambulatory Visit: Payer: Self-pay | Admitting: Dermatology

## 2014-01-02 ENCOUNTER — Other Ambulatory Visit: Payer: Self-pay | Admitting: Dermatology

## 2014-01-04 ENCOUNTER — Other Ambulatory Visit: Payer: Self-pay

## 2014-01-04 DIAGNOSIS — Z1231 Encounter for screening mammogram for malignant neoplasm of breast: Secondary | ICD-10-CM

## 2014-01-14 ENCOUNTER — Ambulatory Visit (INDEPENDENT_AMBULATORY_CARE_PROVIDER_SITE_OTHER): Payer: BC Managed Care – PPO | Admitting: Family Medicine

## 2014-01-14 ENCOUNTER — Encounter: Payer: Self-pay | Admitting: Family Medicine

## 2014-01-14 VITALS — BP 120/84 | Temp 98.0°F

## 2014-01-14 DIAGNOSIS — R079 Chest pain, unspecified: Secondary | ICD-10-CM

## 2014-01-14 DIAGNOSIS — N259 Disorder resulting from impaired renal tubular function, unspecified: Secondary | ICD-10-CM

## 2014-01-14 DIAGNOSIS — Q613 Polycystic kidney, unspecified: Secondary | ICD-10-CM

## 2014-01-14 NOTE — Progress Notes (Signed)
Pre visit review using our clinic review tool, if applicable. No additional management support is needed unless otherwise documented below in the visit note. 

## 2014-01-14 NOTE — Progress Notes (Signed)
   Subjective:    Patient ID: Catherine Walker, female    DOB: 03/17/1966, 48 y.o.   MRN: [id number]  HPI Catherine Walker is a 48 year old single female nonsmoker who comes in today for evaluation of chest tightness with exertion  She is a runner and has noticed some chest tightness when he exerts herself. She had a hemoglobin 10.5 in January. Repeated last Thursday does not know the report. This was done at the nephrology office. She was also told she had class IV kidney disease and brings in a number of articles about renal dialysis and kidney transplants for me to read.  I recommend she discuss this with her nephrologist  She runs about 5 miles per day. Her symptoms usually start when she begins to run however they go away as she runs. She does not have to stop running. She's not been any radiation shortness of breath jaw pain etc. etc. She has no history of underlying coronary artery disease diabetes smoking etc. etc.  Family history of polycystic kidney disease   Review of Systems Review of systems otherwise negative    Objective:   Physical Exam Well-developed well-nourished female no acute distress cardiopulmonary exam normal PMI was in the fifth intercostal space midclavicular line no heaves nor thrills       Assessment & Plan:  Chest discomfort not related to angina.............. followup with nephrology concerning your blood work  Continue exercise program

## 2014-01-14 NOTE — Patient Instructions (Addendum)
Continue your exercise program  Return when necessary  Taper the Ritalin as follows,,,,,,,,,,, 1 Monday Wednesday Friday for 3 weeks then one Monday Thursday for 3 weeks and stop

## 2014-01-29 ENCOUNTER — Ambulatory Visit: Payer: BC Managed Care – PPO

## 2014-01-29 ENCOUNTER — Ambulatory Visit
Admission: RE | Admit: 2014-01-29 | Discharge: 2014-01-29 | Disposition: A | Discharge status: Home / Self Care | Payer: BC Managed Care – PPO | Source: Ambulatory Visit

## 2014-01-29 DIAGNOSIS — Z1231 Encounter for screening mammogram for malignant neoplasm of breast: Secondary | ICD-10-CM

## 2014-01-31 ENCOUNTER — Other Ambulatory Visit: Payer: Self-pay | Admitting: Family Medicine

## 2014-02-11 ENCOUNTER — Other Ambulatory Visit: Payer: Self-pay | Admitting: Dermatology

## 2014-03-14 ENCOUNTER — Other Ambulatory Visit (INDEPENDENT_AMBULATORY_CARE_PROVIDER_SITE_OTHER): Payer: BC Managed Care – PPO

## 2014-03-14 DIAGNOSIS — Z0000 Encounter for general adult medical examination without abnormal findings: Secondary | ICD-10-CM

## 2014-03-14 DIAGNOSIS — E876 Hypokalemia: Secondary | ICD-10-CM

## 2014-03-14 LAB — LIPID PANEL
CHOL/HDL RATIO: 4
CHOLESTEROL: 303 mg/dL — AB (ref 0–200)
HDL: 70 mg/dL (ref >39.00)
LDL CALC: 210 mg/dL — AB (ref 0–99)
NonHDL: 233
TRIGLYCERIDES: 116 mg/dL (ref 0.0–149.0)
VLDL: 23.2 mg/dL (ref 0.0–40.0)

## 2014-03-14 LAB — CBC WITH DIFFERENTIAL/PLATELET
BASOS ABS: 0 10*3/uL (ref 0.0–0.1)
Basophils Relative: 0.6 % (ref 0.0–3.0)
Eosinophils Absolute: 0.1 10*3/uL (ref 0.0–0.7)
Eosinophils Relative: 3.2 % (ref 0.0–5.0)
HCT: 31.2 % — ABNORMAL LOW (ref 36.0–46.0)
Hemoglobin: 10.5 g/dL — ABNORMAL LOW (ref 12.0–15.0)
LYMPHS PCT: 35.7 % (ref 12.0–46.0)
Lymphs Abs: 1.6 10*3/uL (ref 0.7–4.0)
MCHC: 33.7 g/dL (ref 30.0–36.0)
MCV: 96.5 fl (ref 78.0–100.0)
MONOS PCT: 5.6 % (ref 3.0–12.0)
Monocytes Absolute: 0.3 10*3/uL (ref 0.1–1.0)
NEUTROS PCT: 54.9 % (ref 43.0–77.0)
Neutro Abs: 2.5 10*3/uL (ref 1.4–7.7)
PLATELETS: 229 10*3/uL (ref 150.0–400.0)
RBC: 3.23 Mil/uL — ABNORMAL LOW (ref 3.87–5.11)
RDW: 12.5 % (ref 11.5–15.5)
WBC: 4.6 10*3/uL (ref 4.0–10.5)

## 2014-03-14 LAB — POCT URINALYSIS DIPSTICK
BILIRUBIN UA: NEGATIVE
GLUCOSE UA: NEGATIVE
Ketones, UA: NEGATIVE
Leukocytes, UA: NEGATIVE
NITRITE UA: NEGATIVE
RBC UA: NEGATIVE
SPEC GRAV UA: 1.01
Urobilinogen, UA: 0.2
pH, UA: 5

## 2014-03-14 LAB — POTASSIUM: Potassium: 3 mEq/L — ABNORMAL LOW (ref 3.5–5.1)

## 2014-03-14 LAB — HEPATIC FUNCTION PANEL
ALK PHOS: 41 U/L (ref 39–117)
ALT: 11 U/L (ref 0–35)
AST: 17 U/L (ref 0–37)
Albumin: 3.7 g/dL (ref 3.5–5.2)
BILIRUBIN TOTAL: 0.5 mg/dL (ref 0.2–1.2)
Bilirubin, Direct: 0 mg/dL (ref 0.0–0.3)
Total Protein: 7.3 g/dL (ref 6.0–8.3)

## 2014-03-14 LAB — BASIC METABOLIC PANEL
BUN: 46 mg/dL — AB (ref 6–23)
CALCIUM: 9.5 mg/dL (ref 8.4–10.5)
CO2: 29 mEq/L (ref 19–32)
CREATININE: 2.4 mg/dL — AB (ref 0.4–1.2)
Chloride: 102 mEq/L (ref 96–112)
GFR: 22.93 mL/min — AB (ref >60.00)
GLUCOSE: 114 mg/dL — AB (ref 70–99)
Potassium: 3 mEq/L — ABNORMAL LOW (ref 3.5–5.1)
SODIUM: 139 meq/L (ref 135–145)

## 2014-03-14 LAB — TSH: TSH: 0.75 u[IU]/mL (ref 0.35–4.50)

## 2014-03-21 ENCOUNTER — Other Ambulatory Visit (HOSPITAL_COMMUNITY)
Admission: RE | Admit: 2014-03-21 | Discharge: 2014-03-21 | Disposition: A | Discharge status: Home / Self Care | Payer: BC Managed Care – PPO | Source: Ambulatory Visit | Attending: Family Medicine | Admitting: Family Medicine

## 2014-03-21 ENCOUNTER — Ambulatory Visit (INDEPENDENT_AMBULATORY_CARE_PROVIDER_SITE_OTHER): Payer: BC Managed Care – PPO | Admitting: Family Medicine

## 2014-03-21 ENCOUNTER — Encounter: Payer: Self-pay | Admitting: Family Medicine

## 2014-03-21 VITALS — BP 108/78 | Temp 98.1°F | Ht 63.0 in | Wt 110.0 lb

## 2014-03-21 DIAGNOSIS — Q613 Polycystic kidney, unspecified: Secondary | ICD-10-CM

## 2014-03-21 DIAGNOSIS — F3289 Other specified depressive episodes: Secondary | ICD-10-CM

## 2014-03-21 DIAGNOSIS — N259 Disorder resulting from impaired renal tubular function, unspecified: Secondary | ICD-10-CM

## 2014-03-21 DIAGNOSIS — R350 Frequency of micturition: Secondary | ICD-10-CM

## 2014-03-21 DIAGNOSIS — B009 Herpesviral infection, unspecified: Secondary | ICD-10-CM

## 2014-03-21 DIAGNOSIS — Z01419 Encounter for gynecological examination (general) (routine) without abnormal findings: Secondary | ICD-10-CM | POA: Insufficient documentation

## 2014-03-21 DIAGNOSIS — F329 Major depressive disorder, single episode, unspecified: Secondary | ICD-10-CM

## 2014-03-21 MED ORDER — ACYCLOVIR 200 MG PO CAPS: 200.0000 mg | ORAL_CAPSULE | ORAL | Status: DC

## 2014-03-21 MED ORDER — POTASSIUM CHLORIDE CRYS ER 20 MEQ PO TBCR: EXTENDED_RELEASE_TABLET | ORAL | Status: DC

## 2014-03-21 MED ORDER — SERTRALINE HCL 50 MG PO TABS: 50.0000 mg | ORAL_TABLET | Freq: Every day | ORAL | Status: DC

## 2014-03-21 NOTE — Patient Instructions (Signed)
Check with your nephrologist today concerning the low potassium  Return in one year for general physical examination sooner if any problems

## 2014-03-21 NOTE — Progress Notes (Signed)
   Subjective:    Patient ID: Catherine Walker, female    DOB: 24-May-1966, 48 y.o.   MRN: [id number]  HPI Catherine Walker is a 48 year old single female nonsmoker who comes in today for general physical examination because of a history of HSV 1, polycystic kidney disease, history of mild depression  She states overall she feels well. She's been running a lot training for marathon. She takes acyclovir 200 mg daily because of a history of genital herpes. She's not had an outbreak in many years. She takes potassium supposedly every other day however she tends to skip it. Recent potassium week ago was 3.0%. She had another level drawn on Monday of this week at her nephrology office. She'll followup with them.  She takes Zoloft 50 mg a history of mild depression and Maxide 25 one half tab daily fluid retention.  She gets routine eye care, dental care, BSE monthly, and you mammography, colonoscopy not until 50 no family history of colon polyps or change in bowel habits etc. etc.  Vaccinations up-to-date  She is due to have Mohs surgery because of a basal cell carcinoma on the right side of her nose. She also has a couple lesions on her left arm,,,,,,,,,,, referred back to her dermatologist Dr. Yetta BarreJones   Review of Systems  Constitutional: Negative.   HENT: Negative.   Eyes: Negative.   Respiratory: Negative.   Cardiovascular: Negative.   Gastrointestinal: Negative.   Genitourinary: Negative.   Musculoskeletal: Negative.   Neurological: Negative.   Psychiatric/Behavioral: Negative.        Objective:   Physical Exam  Constitutional: She is oriented to person, place, and time. She appears well-developed and well-nourished.  HENT:  Head: Normocephalic and atraumatic.  Right Ear: External ear normal.  Left Ear: External ear normal.  Nose: Nose normal.  Mouth/Throat: Oropharynx is clear and moist.  Eyes: EOM are normal. Pupils are equal, round, and reactive to light.  Neck: Normal range of motion.  Neck supple. No thyromegaly present.  Cardiovascular: Normal rate, regular rhythm, normal heart sounds and intact distal pulses.  Exam reveals no gallop and no friction rub.   No murmur heard. Pulmonary/Chest: Effort normal and breath sounds normal.  Abdominal: Soft. Bowel sounds are normal. She exhibits no distension and no mass. There is no tenderness. There is no rebound.  Genitourinary: Vagina normal and uterus normal. Guaiac negative stool. No vaginal discharge found.  Bilateral breast exam normal  Musculoskeletal: Normal range of motion. She exhibits no edema and no tenderness.  Lymphadenopathy:    She has no cervical adenopathy.  Neurological: She is alert and oriented to person, place, and time. She has normal reflexes. No cranial nerve deficit. She exhibits normal muscle tone. Coordination normal.  Skin: Skin is warm and dry.  Psychiatric: She has a normal mood and affect. Her behavior is normal. Judgment and thought content normal.          Assessment & Plan:  Healthy female  History of genital HSV 1,,,,,,,,, acyclovir daily,,,,,,,, Pap smear yearly  Polycystic kidney disease,,,,,,,, continue current therapy followup on low potassium with nephrology  History of mild depression continue Zoloft,

## 2014-03-21 NOTE — Progress Notes (Signed)
Pre visit review using our clinic review tool, if applicable. No additional management support is needed unless otherwise documented below in the visit note. 

## 2014-03-25 LAB — CYTOLOGY - PAP

## 2014-03-26 ENCOUNTER — Encounter (HOSPITAL_COMMUNITY)
Admission: RE | Admit: 2014-03-26 | Discharge: 2014-03-26 | Disposition: A | Discharge status: Home / Self Care | Payer: BC Managed Care – PPO | Source: Ambulatory Visit | Attending: Nephrology | Admitting: Nephrology

## 2014-03-26 DIAGNOSIS — N189 Chronic kidney disease, unspecified: Secondary | ICD-10-CM | POA: Insufficient documentation

## 2014-03-26 DIAGNOSIS — D631 Anemia in chronic kidney disease: Secondary | ICD-10-CM | POA: Diagnosis not present

## 2014-03-26 DIAGNOSIS — N039 Chronic nephritic syndrome with unspecified morphologic changes: Secondary | ICD-10-CM | POA: Diagnosis present

## 2014-03-26 LAB — POCT HEMOGLOBIN-HEMACUE: Hemoglobin: 9.7 g/dL — ABNORMAL LOW (ref 12.0–15.0)

## 2014-03-26 MED ORDER — EPOETIN ALFA 20000 UNIT/ML IJ SOLN: 20000.0000 [IU] | INTRAMUSCULAR | Status: DC

## 2014-03-26 MED ORDER — EPOETIN ALFA 20000 UNIT/ML IJ SOLN
INTRAMUSCULAR | Status: AC
  Administered 2014-03-26: 20000 [IU] via SUBCUTANEOUS
  Filled 2014-03-26: qty 1

## 2014-04-16 ENCOUNTER — Encounter (HOSPITAL_COMMUNITY)
Admission: RE | Admit: 2014-04-16 | Discharge: 2014-04-16 | Disposition: A | Discharge status: Home / Self Care | Payer: BC Managed Care – PPO | Source: Ambulatory Visit | Attending: Nephrology | Admitting: Nephrology

## 2014-04-16 DIAGNOSIS — D631 Anemia in chronic kidney disease: Secondary | ICD-10-CM | POA: Insufficient documentation

## 2014-04-16 DIAGNOSIS — N039 Chronic nephritic syndrome with unspecified morphologic changes: Principal | ICD-10-CM

## 2014-04-16 DIAGNOSIS — N189 Chronic kidney disease, unspecified: Secondary | ICD-10-CM | POA: Diagnosis present

## 2014-04-16 LAB — IRON AND TIBC
IRON: 129 ug/dL (ref 42–135)
Saturation Ratios: 41 % (ref 20–55)
TIBC: 315 ug/dL (ref 250–470)
UIBC: 186 ug/dL (ref 125–400)

## 2014-04-16 LAB — POCT HEMOGLOBIN-HEMACUE: HEMOGLOBIN: 11.3 g/dL — AB (ref 12.0–15.0)

## 2014-04-16 LAB — FERRITIN: FERRITIN: 82 ng/mL (ref 10–291)

## 2014-04-16 MED ORDER — EPOETIN ALFA 20000 UNIT/ML IJ SOLN
20000.0000 [IU] | INTRAMUSCULAR | Status: DC
  Administered 2014-04-16: 20000 [IU] via SUBCUTANEOUS

## 2014-04-16 MED ORDER — EPOETIN ALFA 20000 UNIT/ML IJ SOLN
INTRAMUSCULAR | Status: AC
  Administered 2014-04-16: 20000 [IU] via SUBCUTANEOUS
  Filled 2014-04-16: qty 1

## 2014-04-23 ENCOUNTER — Encounter (HOSPITAL_COMMUNITY): Payer: BC Managed Care – PPO

## 2014-05-14 ENCOUNTER — Encounter (HOSPITAL_COMMUNITY): Payer: BC Managed Care – PPO

## 2014-06-18 ENCOUNTER — Other Ambulatory Visit: Payer: Self-pay | Admitting: Dermatology

## 2014-07-19 ENCOUNTER — Other Ambulatory Visit: Payer: Self-pay | Admitting: Family Medicine

## 2014-07-30 ENCOUNTER — Other Ambulatory Visit: Payer: Self-pay | Admitting: Family Medicine

## 2014-09-09 ENCOUNTER — Other Ambulatory Visit (HOSPITAL_COMMUNITY): Payer: Self-pay | Admitting: *Deleted

## 2014-09-10 ENCOUNTER — Encounter (HOSPITAL_COMMUNITY)
Admission: RE | Admit: 2014-09-10 | Discharge: 2014-09-10 | Disposition: A | Discharge status: Home / Self Care | Payer: BLUE CROSS/BLUE SHIELD | Source: Ambulatory Visit | Attending: Nephrology | Admitting: Nephrology

## 2014-09-10 DIAGNOSIS — D631 Anemia in chronic kidney disease: Secondary | ICD-10-CM | POA: Insufficient documentation

## 2014-09-10 DIAGNOSIS — N184 Chronic kidney disease, stage 4 (severe): Secondary | ICD-10-CM | POA: Insufficient documentation

## 2014-09-10 DIAGNOSIS — Z5181 Encounter for therapeutic drug level monitoring: Secondary | ICD-10-CM | POA: Diagnosis not present

## 2014-09-10 LAB — IRON AND TIBC
Iron: 72 ug/dL (ref 42–145)
Saturation Ratios: 28 % (ref 20–55)
TIBC: 261 ug/dL (ref 250–470)
UIBC: 189 ug/dL (ref 125–400)

## 2014-09-10 LAB — POCT HEMOGLOBIN-HEMACUE: Hemoglobin: 9.6 g/dL — ABNORMAL LOW (ref 12.0–15.0)

## 2014-09-10 LAB — FERRITIN: Ferritin: 94 ng/mL (ref 10–291)

## 2014-09-10 MED ORDER — EPOETIN ALFA 20000 UNIT/ML IJ SOLN
INTRAMUSCULAR | Status: AC
  Administered 2014-09-10: 20000 [IU] via SUBCUTANEOUS
  Filled 2014-09-10: qty 1

## 2014-09-10 MED ORDER — EPOETIN ALFA 20000 UNIT/ML IJ SOLN
20000.0000 [IU] | INTRAMUSCULAR | Status: DC
  Administered 2014-09-10: 20000 [IU] via SUBCUTANEOUS

## 2014-09-29 ENCOUNTER — Other Ambulatory Visit: Payer: Self-pay | Admitting: Family Medicine

## 2014-10-08 ENCOUNTER — Encounter (HOSPITAL_COMMUNITY)
Admission: RE | Admit: 2014-10-08 | Discharge: 2014-10-08 | Disposition: A | Discharge status: Home / Self Care | Payer: BLUE CROSS/BLUE SHIELD | Source: Ambulatory Visit | Attending: Nephrology | Admitting: Nephrology

## 2014-10-08 DIAGNOSIS — N184 Chronic kidney disease, stage 4 (severe): Secondary | ICD-10-CM | POA: Insufficient documentation

## 2014-10-08 DIAGNOSIS — Z5181 Encounter for therapeutic drug level monitoring: Secondary | ICD-10-CM | POA: Insufficient documentation

## 2014-10-08 DIAGNOSIS — D631 Anemia in chronic kidney disease: Secondary | ICD-10-CM | POA: Diagnosis present

## 2014-10-08 LAB — POCT HEMOGLOBIN-HEMACUE: Hemoglobin: 11 g/dL — ABNORMAL LOW (ref 12.0–15.0)

## 2014-10-08 MED ORDER — EPOETIN ALFA 20000 UNIT/ML IJ SOLN
20000.0000 [IU] | INTRAMUSCULAR | Status: DC
  Administered 2014-10-08: 20000 [IU] via SUBCUTANEOUS

## 2014-10-09 LAB — IRON AND TIBC
Iron: 79 ug/dL (ref 42–145)
Saturation Ratios: 26 % (ref 20–55)
TIBC: 306 ug/dL (ref 250–470)
UIBC: 227 ug/dL (ref 125–400)

## 2014-10-09 LAB — FERRITIN: Ferritin: 99 ng/mL (ref 10–291)

## 2014-10-09 MED ORDER — EPOETIN ALFA 20000 UNIT/ML IJ SOLN
INTRAMUSCULAR | Status: AC
  Filled 2014-10-09: qty 1

## 2014-11-05 ENCOUNTER — Inpatient Hospital Stay (HOSPITAL_COMMUNITY): Admission: RE | Admit: 2014-11-05 | Payer: BLUE CROSS/BLUE SHIELD | Source: Ambulatory Visit

## 2014-12-02 ENCOUNTER — Encounter (HOSPITAL_COMMUNITY)
Admission: RE | Admit: 2014-12-02 | Discharge: 2014-12-02 | Disposition: A | Discharge status: Home / Self Care | Payer: BLUE CROSS/BLUE SHIELD | Source: Ambulatory Visit | Attending: Nephrology | Admitting: Nephrology

## 2014-12-02 DIAGNOSIS — N184 Chronic kidney disease, stage 4 (severe): Secondary | ICD-10-CM | POA: Diagnosis not present

## 2014-12-02 DIAGNOSIS — Z5181 Encounter for therapeutic drug level monitoring: Secondary | ICD-10-CM | POA: Diagnosis not present

## 2014-12-02 DIAGNOSIS — D631 Anemia in chronic kidney disease: Secondary | ICD-10-CM | POA: Diagnosis not present

## 2014-12-02 LAB — POCT HEMOGLOBIN-HEMACUE: HEMOGLOBIN: 10.7 g/dL — AB (ref 12.0–15.0)

## 2014-12-02 MED ORDER — EPOETIN ALFA 20000 UNIT/ML IJ SOLN
20000.0000 [IU] | INTRAMUSCULAR | Status: DC
  Administered 2014-12-02: 20000 [IU] via SUBCUTANEOUS

## 2014-12-02 MED ORDER — EPOETIN ALFA 20000 UNIT/ML IJ SOLN
INTRAMUSCULAR | Status: AC
  Filled 2014-12-02: qty 1

## 2014-12-03 LAB — FERRITIN: Ferritin: 80 ng/mL (ref 10–291)

## 2014-12-03 LAB — IRON AND TIBC
IRON: 93 ug/dL (ref 42–145)
Saturation Ratios: 30 % (ref 20–55)
TIBC: 311 ug/dL (ref 250–470)
UIBC: 218 ug/dL (ref 125–400)

## 2014-12-30 ENCOUNTER — Other Ambulatory Visit (HOSPITAL_COMMUNITY): Payer: Self-pay | Admitting: *Deleted

## 2014-12-31 ENCOUNTER — Encounter (HOSPITAL_COMMUNITY)
Admission: RE | Admit: 2014-12-31 | Discharge: 2014-12-31 | Disposition: A | Discharge status: Home / Self Care | Payer: BLUE CROSS/BLUE SHIELD | Source: Ambulatory Visit | Attending: Nephrology | Admitting: Nephrology

## 2014-12-31 DIAGNOSIS — N184 Chronic kidney disease, stage 4 (severe): Secondary | ICD-10-CM | POA: Diagnosis not present

## 2014-12-31 DIAGNOSIS — D631 Anemia in chronic kidney disease: Secondary | ICD-10-CM | POA: Insufficient documentation

## 2014-12-31 DIAGNOSIS — Z5181 Encounter for therapeutic drug level monitoring: Secondary | ICD-10-CM | POA: Diagnosis not present

## 2014-12-31 LAB — POCT HEMOGLOBIN-HEMACUE: HEMOGLOBIN: 11.5 g/dL — AB (ref 12.0–15.0)

## 2014-12-31 LAB — IRON AND TIBC
Iron: 100 ug/dL (ref 28–170)
SATURATION RATIOS: 29 % (ref 10.4–31.8)
TIBC: 342 ug/dL (ref 250–450)
UIBC: 242 ug/dL

## 2014-12-31 LAB — FERRITIN: FERRITIN: 86 ng/mL (ref 11–307)

## 2014-12-31 MED ORDER — EPOETIN ALFA 20000 UNIT/ML IJ SOLN
INTRAMUSCULAR | Status: AC
  Filled 2014-12-31: qty 1

## 2014-12-31 MED ORDER — EPOETIN ALFA 20000 UNIT/ML IJ SOLN
20000.0000 [IU] | INTRAMUSCULAR | Status: DC
  Administered 2014-12-31: 20000 [IU] via SUBCUTANEOUS

## 2015-02-18 ENCOUNTER — Encounter (HOSPITAL_COMMUNITY)
Admission: RE | Admit: 2015-02-18 | Discharge: 2015-02-18 | Disposition: A | Discharge status: Home / Self Care | Payer: BLUE CROSS/BLUE SHIELD | Source: Ambulatory Visit | Attending: Nephrology | Admitting: Nephrology

## 2015-02-18 DIAGNOSIS — Z5181 Encounter for therapeutic drug level monitoring: Secondary | ICD-10-CM | POA: Insufficient documentation

## 2015-02-18 DIAGNOSIS — D631 Anemia in chronic kidney disease: Secondary | ICD-10-CM | POA: Diagnosis not present

## 2015-02-18 DIAGNOSIS — N184 Chronic kidney disease, stage 4 (severe): Secondary | ICD-10-CM | POA: Diagnosis not present

## 2015-02-18 LAB — IRON AND TIBC
IRON: 110 ug/dL (ref 28–170)
Saturation Ratios: 35 % — ABNORMAL HIGH (ref 10.4–31.8)
TIBC: 315 ug/dL (ref 250–450)
UIBC: 205 ug/dL

## 2015-02-18 LAB — POCT HEMOGLOBIN-HEMACUE: HEMOGLOBIN: 10 g/dL — AB (ref 12.0–15.0)

## 2015-02-18 LAB — FERRITIN: Ferritin: 76 ng/mL (ref 11–307)

## 2015-02-18 MED ORDER — EPOETIN ALFA 20000 UNIT/ML IJ SOLN
INTRAMUSCULAR | Status: AC
  Administered 2015-02-18: 20000 [IU] via SUBCUTANEOUS
  Filled 2015-02-18: qty 1

## 2015-02-18 MED ORDER — EPOETIN ALFA 20000 UNIT/ML IJ SOLN: 20000.0000 [IU] | INTRAMUSCULAR | Status: DC

## 2015-02-21 ENCOUNTER — Other Ambulatory Visit: Payer: Self-pay | Admitting: Family Medicine

## 2015-02-25 ENCOUNTER — Other Ambulatory Visit: Payer: Self-pay

## 2015-02-25 DIAGNOSIS — Z1231 Encounter for screening mammogram for malignant neoplasm of breast: Secondary | ICD-10-CM

## 2015-03-06 ENCOUNTER — Ambulatory Visit
Admission: RE | Admit: 2015-03-06 | Discharge: 2015-03-06 | Disposition: A | Discharge status: Home / Self Care | Payer: BLUE CROSS/BLUE SHIELD | Source: Ambulatory Visit

## 2015-03-06 DIAGNOSIS — Z1231 Encounter for screening mammogram for malignant neoplasm of breast: Secondary | ICD-10-CM

## 2015-03-17 ENCOUNTER — Encounter (HOSPITAL_COMMUNITY)
Admission: RE | Admit: 2015-03-17 | Discharge: 2015-03-17 | Disposition: A | Discharge status: Home / Self Care | Payer: BLUE CROSS/BLUE SHIELD | Source: Ambulatory Visit | Attending: Nephrology | Admitting: Nephrology

## 2015-03-17 DIAGNOSIS — Z79899 Other long term (current) drug therapy: Secondary | ICD-10-CM | POA: Insufficient documentation

## 2015-03-17 DIAGNOSIS — N184 Chronic kidney disease, stage 4 (severe): Secondary | ICD-10-CM | POA: Diagnosis not present

## 2015-03-17 DIAGNOSIS — Z5181 Encounter for therapeutic drug level monitoring: Secondary | ICD-10-CM | POA: Insufficient documentation

## 2015-03-17 DIAGNOSIS — D509 Iron deficiency anemia, unspecified: Secondary | ICD-10-CM | POA: Insufficient documentation

## 2015-03-17 DIAGNOSIS — D631 Anemia in chronic kidney disease: Secondary | ICD-10-CM | POA: Diagnosis not present

## 2015-03-17 LAB — IRON AND TIBC
IRON: 93 ug/dL (ref 28–170)
Saturation Ratios: 27 % (ref 10.4–31.8)
TIBC: 349 ug/dL (ref 250–450)
UIBC: 256 ug/dL

## 2015-03-17 LAB — FERRITIN: Ferritin: 53 ng/mL (ref 11–307)

## 2015-03-17 LAB — POCT HEMOGLOBIN-HEMACUE: Hemoglobin: 10.6 g/dL — ABNORMAL LOW (ref 12.0–15.0)

## 2015-03-17 MED ORDER — EPOETIN ALFA 20000 UNIT/ML IJ SOLN
INTRAMUSCULAR | Status: AC
  Filled 2015-03-17: qty 1

## 2015-03-17 MED ORDER — EPOETIN ALFA 20000 UNIT/ML IJ SOLN
20000.0000 [IU] | INTRAMUSCULAR | Status: DC
  Administered 2015-03-17: 20000 [IU] via SUBCUTANEOUS

## 2015-03-18 ENCOUNTER — Encounter (HOSPITAL_COMMUNITY): Payer: BLUE CROSS/BLUE SHIELD

## 2015-04-13 ENCOUNTER — Other Ambulatory Visit: Payer: Self-pay | Admitting: Family Medicine

## 2015-04-14 ENCOUNTER — Other Ambulatory Visit (HOSPITAL_COMMUNITY): Payer: Self-pay | Admitting: *Deleted

## 2015-04-15 ENCOUNTER — Encounter (HOSPITAL_COMMUNITY)
Admission: RE | Admit: 2015-04-15 | Discharge: 2015-04-15 | Disposition: A | Discharge status: Home / Self Care | Payer: BLUE CROSS/BLUE SHIELD | Source: Ambulatory Visit | Attending: Nephrology | Admitting: Nephrology

## 2015-04-15 DIAGNOSIS — N184 Chronic kidney disease, stage 4 (severe): Secondary | ICD-10-CM | POA: Diagnosis not present

## 2015-04-15 LAB — POCT HEMOGLOBIN-HEMACUE: Hemoglobin: 11.7 g/dL — ABNORMAL LOW (ref 12.0–15.0)

## 2015-04-15 MED ORDER — EPOETIN ALFA 20000 UNIT/ML IJ SOLN
INTRAMUSCULAR | Status: AC
  Administered 2015-04-15: 20000 [IU] via SUBCUTANEOUS
  Filled 2015-04-15: qty 1

## 2015-04-15 MED ORDER — SODIUM CHLORIDE 0.9 % IV SOLN
510.0000 mg | Freq: Once | INTRAVENOUS | Status: AC
  Administered 2015-04-15: 510 mg via INTRAVENOUS
  Filled 2015-04-15: qty 17

## 2015-04-15 MED ORDER — EPOETIN ALFA 20000 UNIT/ML IJ SOLN: 20000.0000 [IU] | INTRAMUSCULAR | Status: DC

## 2015-04-15 NOTE — Discharge Instructions (Signed)

## 2015-05-06 ENCOUNTER — Other Ambulatory Visit: Payer: Self-pay | Admitting: Family Medicine

## 2015-05-20 ENCOUNTER — Other Ambulatory Visit (INDEPENDENT_AMBULATORY_CARE_PROVIDER_SITE_OTHER): Payer: BLUE CROSS/BLUE SHIELD

## 2015-05-20 DIAGNOSIS — Z0000 Encounter for general adult medical examination without abnormal findings: Secondary | ICD-10-CM | POA: Diagnosis not present

## 2015-05-20 LAB — POCT URINALYSIS DIPSTICK
Bilirubin, UA: NEGATIVE
Glucose, UA: NEGATIVE
Ketones, UA: NEGATIVE
NITRITE UA: NEGATIVE
PH UA: 5.5
Spec Grav, UA: 1.015
UROBILINOGEN UA: 0.2

## 2015-05-20 LAB — CBC WITH DIFFERENTIAL/PLATELET
BASOS PCT: 0.8 % (ref 0.0–3.0)
Basophils Absolute: 0 10*3/uL (ref 0.0–0.1)
Eosinophils Absolute: 0.2 10*3/uL (ref 0.0–0.7)
Eosinophils Relative: 4.7 % (ref 0.0–5.0)
HCT: 33.5 % — ABNORMAL LOW (ref 36.0–46.0)
Hemoglobin: 11.3 g/dL — ABNORMAL LOW (ref 12.0–15.0)
Lymphocytes Relative: 36.2 % (ref 12.0–46.0)
Lymphs Abs: 1.5 10*3/uL (ref 0.7–4.0)
MCHC: 33.6 g/dL (ref 30.0–36.0)
MCV: 96.5 fl (ref 78.0–100.0)
MONOS PCT: 6.5 % (ref 3.0–12.0)
Monocytes Absolute: 0.3 10*3/uL (ref 0.1–1.0)
NEUTROS ABS: 2.1 10*3/uL (ref 1.4–7.7)
Neutrophils Relative %: 51.8 % (ref 43.0–77.0)
PLATELETS: 205 10*3/uL (ref 150.0–400.0)
RBC: 3.47 Mil/uL — ABNORMAL LOW (ref 3.87–5.11)
RDW: 13.7 % (ref 11.5–15.5)
WBC: 4.1 10*3/uL (ref 4.0–10.5)

## 2015-05-20 LAB — LIPID PANEL
Cholesterol: 256 mg/dL — ABNORMAL HIGH (ref 0–200)
HDL: 74.7 mg/dL (ref >39.00)
LDL Cholesterol: 163 mg/dL — ABNORMAL HIGH (ref 0–99)
NONHDL: 181.69
Total CHOL/HDL Ratio: 3
Triglycerides: 91 mg/dL (ref 0.0–149.0)
VLDL: 18.2 mg/dL (ref 0.0–40.0)

## 2015-05-20 LAB — HEPATIC FUNCTION PANEL
ALBUMIN: 3.9 g/dL (ref 3.5–5.2)
ALT: 13 U/L (ref 0–35)
AST: 17 U/L (ref 0–37)
Alkaline Phosphatase: 44 U/L (ref 39–117)
Bilirubin, Direct: 0 mg/dL (ref 0.0–0.3)
TOTAL PROTEIN: 6.9 g/dL (ref 6.0–8.3)
Total Bilirubin: 0.4 mg/dL (ref 0.2–1.2)

## 2015-05-20 LAB — BASIC METABOLIC PANEL
BUN: 58 mg/dL — ABNORMAL HIGH (ref 6–23)
CO2: 30 meq/L (ref 19–32)
Calcium: 9.4 mg/dL (ref 8.4–10.5)
Chloride: 100 mEq/L (ref 96–112)
Creatinine, Ser: 2.54 mg/dL — ABNORMAL HIGH (ref 0.40–1.20)
GFR: 21.37 mL/min — ABNORMAL LOW (ref >60.00)
Glucose, Bld: 82 mg/dL (ref 70–99)
POTASSIUM: 3.3 meq/L — AB (ref 3.5–5.1)
SODIUM: 139 meq/L (ref 135–145)

## 2015-05-20 LAB — TSH: TSH: 1.91 u[IU]/mL (ref 0.35–4.50)

## 2015-05-27 ENCOUNTER — Encounter: Payer: Self-pay | Admitting: Family Medicine

## 2015-05-27 ENCOUNTER — Ambulatory Visit (INDEPENDENT_AMBULATORY_CARE_PROVIDER_SITE_OTHER): Payer: BLUE CROSS/BLUE SHIELD | Admitting: Family Medicine

## 2015-05-27 VITALS — BP 120/80 | HR 88 | Temp 97.9°F | Ht 63.0 in | Wt 109.0 lb

## 2015-05-27 DIAGNOSIS — Q613 Polycystic kidney, unspecified: Secondary | ICD-10-CM

## 2015-05-27 DIAGNOSIS — N259 Disorder resulting from impaired renal tubular function, unspecified: Secondary | ICD-10-CM | POA: Diagnosis not present

## 2015-05-27 DIAGNOSIS — F329 Major depressive disorder, single episode, unspecified: Secondary | ICD-10-CM

## 2015-05-27 DIAGNOSIS — F32A Depression, unspecified: Secondary | ICD-10-CM

## 2015-05-27 DIAGNOSIS — Z23 Encounter for immunization: Secondary | ICD-10-CM

## 2015-05-27 DIAGNOSIS — Z0000 Encounter for general adult medical examination without abnormal findings: Secondary | ICD-10-CM | POA: Diagnosis not present

## 2015-05-27 MED ORDER — POTASSIUM CHLORIDE CRYS ER 20 MEQ PO TBCR: EXTENDED_RELEASE_TABLET | ORAL | Status: DC

## 2015-05-27 MED ORDER — ACYCLOVIR 200 MG PO CAPS: 200.0000 mg | ORAL_CAPSULE | Freq: Every day | ORAL | Status: DC

## 2015-05-27 MED ORDER — TRIAMTERENE-HCTZ 37.5-25 MG PO TABS: ORAL_TABLET | ORAL | Status: DC

## 2015-05-27 MED ORDER — SERTRALINE HCL 50 MG PO TABS: 50.0000 mg | ORAL_TABLET | Freq: Every day | ORAL | Status: DC

## 2015-05-27 NOTE — Progress Notes (Signed)
Subjective:    Patient ID: Catherine Walker, female    DOB: 1965/12/25, 49 y.o.   MRN: [id number]  HPI Rushie is a 49 year old single female nonsmoker who comes in today for general physical examination because of a history of polycystic kidney disease  She currently takes 120 milk: Pat potassium tablet daily, Maxide 30 7. 5-25 dose one half tab daily. BP 140/84. She tells me at home which is a 120/80  She's not taken any oral iron. She was given IV iron this past summer and her hemoglobin with the Procrit came up to 12.  She also takes Calcitrol at the direction of Dr. Myriam Jacobson her nephrologist. She wishes to switch to the nephrology center in [facility].  She gets routine eye care, dental care, mammography 2016 July normal, colonoscopy not until age 79. No family history of colon cancer polyps  LMP 3 years ago last Pap last year normal no history of any abnormal Paps and she is asymptomatic therefore recommend pelvic exam every 3 years.  She got a flu shot at [facility] October 1. She'll be given a tetanus booster here today.  Her blood pressure when she came in was 140/84. After physical exam BP 120/80 right arm sitting position. She is also getting some elevated pressure readings at home but she has a cold cuff. She ordered a new cuff. Advised her how to check your blood pressure at home   Review of Systems  Constitutional: Negative.   HENT: Negative.   Eyes: Negative.   Respiratory: Negative.   Cardiovascular: Negative.   Gastrointestinal: Negative.   Endocrine: Negative.   Genitourinary: Negative.   Musculoskeletal: Negative.   Skin: Negative.   Allergic/Immunologic: Negative.   Neurological: Negative.   Hematological: Negative.   Psychiatric/Behavioral: Negative.        Objective:   Physical Exam  Constitutional: She appears well-developed and well-nourished.  HENT:  Head: Normocephalic and atraumatic.  Right Ear: External ear normal.  Left Ear: External ear normal.    Nose: Nose normal.  Mouth/Throat: Oropharynx is clear and moist.  Eyes: EOM are normal. Pupils are equal, round, and reactive to light.  Neck: Normal range of motion. Neck supple. No JVD present. No tracheal deviation present. No thyromegaly present.  Cardiovascular: Normal rate, regular rhythm, normal heart sounds and intact distal pulses.  Exam reveals no gallop and no friction rub.   No murmur heard. Pulmonary/Chest: Effort normal and breath sounds normal. No stridor. No respiratory distress. She has no wheezes. She has no rales. She exhibits no tenderness.  Abdominal: Soft. Bowel sounds are normal. She exhibits no distension and no mass. There is no tenderness. There is no rebound and no guarding.  Genitourinary:  Bilateral breast exam normal  Musculoskeletal: Normal range of motion. She exhibits no edema or tenderness.  Lymphadenopathy:    She has no cervical adenopathy.  Neurological: She is alert. She has normal reflexes. No cranial nerve deficit. She exhibits normal muscle tone. Coordination normal.  Skin: Skin is warm and dry. No rash noted. No erythema. No pallor.  Total body skin exam normal she has a lot of seborrheic and actinic keratosis on her legs. She's currently due to see Dr. Yetta Barre next month  Psychiatric: She has a normal mood and affect. Her behavior is normal. Judgment and thought content normal.  Nursing note and vitals reviewed.         Assessment & Plan:  Polycystic kidney disease BUN/creatinine GFR stable.  Anemia secondary to chronic  kidney disease currently hemoglobin normal  History of mild depression continue Zoloft 50 mg  History of mild hypertension continue Maxide one half tab daily and a potassium supplement.  Chronic sun damage........ follow-up with dermatology

## 2015-05-27 NOTE — Patient Instructions (Signed)
Continue current medications  Check your blood pressure weekly.......Marland Kitchen 135/85 or less......... BP today 120/80  Follow-up in one year sooner if any problems

## 2015-06-02 ENCOUNTER — Encounter (HOSPITAL_COMMUNITY)
Admission: RE | Admit: 2015-06-02 | Discharge: 2015-06-02 | Disposition: A | Discharge status: Home / Self Care | Payer: BLUE CROSS/BLUE SHIELD | Source: Ambulatory Visit | Attending: Nephrology | Admitting: Nephrology

## 2015-06-02 DIAGNOSIS — D509 Iron deficiency anemia, unspecified: Secondary | ICD-10-CM | POA: Diagnosis not present

## 2015-06-02 DIAGNOSIS — D631 Anemia in chronic kidney disease: Secondary | ICD-10-CM | POA: Diagnosis not present

## 2015-06-02 DIAGNOSIS — Z79899 Other long term (current) drug therapy: Secondary | ICD-10-CM | POA: Diagnosis not present

## 2015-06-02 DIAGNOSIS — Z5181 Encounter for therapeutic drug level monitoring: Secondary | ICD-10-CM | POA: Insufficient documentation

## 2015-06-02 DIAGNOSIS — N184 Chronic kidney disease, stage 4 (severe): Secondary | ICD-10-CM | POA: Insufficient documentation

## 2015-06-02 LAB — POCT HEMOGLOBIN-HEMACUE: HEMOGLOBIN: 10.5 g/dL — AB (ref 12.0–15.0)

## 2015-06-02 LAB — IRON AND TIBC
IRON: 101 ug/dL (ref 28–170)
SATURATION RATIOS: 36 % — AB (ref 10.4–31.8)
TIBC: 281 ug/dL (ref 250–450)
UIBC: 180 ug/dL

## 2015-06-02 LAB — FERRITIN: FERRITIN: 220 ng/mL (ref 11–307)

## 2015-06-02 MED ORDER — EPOETIN ALFA 20000 UNIT/ML IJ SOLN
20000.0000 [IU] | INTRAMUSCULAR | Status: DC
  Administered 2015-06-02: 20000 [IU] via SUBCUTANEOUS

## 2015-06-02 MED ORDER — EPOETIN ALFA 20000 UNIT/ML IJ SOLN
INTRAMUSCULAR | Status: AC
  Filled 2015-06-02: qty 1

## 2015-06-30 ENCOUNTER — Encounter (HOSPITAL_COMMUNITY)
Admission: RE | Admit: 2015-06-30 | Discharge: 2015-06-30 | Disposition: A | Discharge status: Home / Self Care | Payer: BLUE CROSS/BLUE SHIELD | Source: Ambulatory Visit | Attending: Nephrology | Admitting: Nephrology

## 2015-06-30 DIAGNOSIS — Z79899 Other long term (current) drug therapy: Secondary | ICD-10-CM | POA: Insufficient documentation

## 2015-06-30 DIAGNOSIS — N184 Chronic kidney disease, stage 4 (severe): Secondary | ICD-10-CM | POA: Diagnosis present

## 2015-06-30 DIAGNOSIS — Z5181 Encounter for therapeutic drug level monitoring: Secondary | ICD-10-CM | POA: Insufficient documentation

## 2015-06-30 DIAGNOSIS — D631 Anemia in chronic kidney disease: Secondary | ICD-10-CM | POA: Insufficient documentation

## 2015-06-30 DIAGNOSIS — D509 Iron deficiency anemia, unspecified: Secondary | ICD-10-CM | POA: Insufficient documentation

## 2015-06-30 LAB — IRON AND TIBC
IRON: 103 ug/dL (ref 28–170)
Saturation Ratios: 34 % — ABNORMAL HIGH (ref 10.4–31.8)
TIBC: 305 ug/dL (ref 250–450)
UIBC: 202 ug/dL

## 2015-06-30 LAB — POCT HEMOGLOBIN-HEMACUE: Hemoglobin: 11.4 g/dL — ABNORMAL LOW (ref 12.0–15.0)

## 2015-06-30 LAB — FERRITIN: FERRITIN: 238 ng/mL (ref 11–307)

## 2015-06-30 MED ORDER — EPOETIN ALFA 20000 UNIT/ML IJ SOLN
INTRAMUSCULAR | Status: AC
  Administered 2015-06-30: 20000 [IU] via SUBCUTANEOUS
  Filled 2015-06-30: qty 1

## 2015-06-30 MED ORDER — EPOETIN ALFA 20000 UNIT/ML IJ SOLN
20000.0000 [IU] | INTRAMUSCULAR | Status: DC
  Administered 2015-06-30: 20000 [IU] via SUBCUTANEOUS

## 2015-07-15 ENCOUNTER — Telehealth: Payer: Self-pay | Admitting: Family Medicine

## 2015-07-15 MED ORDER — METHYLPHENIDATE HCL 20 MG PO TABS: 20.0000 mg | ORAL_TABLET | Freq: Every day | ORAL | Status: DC

## 2015-07-15 NOTE — Telephone Encounter (Signed)
Rx ready for pick up and patient is aware 

## 2015-07-15 NOTE — Telephone Encounter (Signed)
Pt call to say she would like to start back taking ADHD meds. I told her she wouls probably need an appt and she said fine let Fleet ContrasRachel tell me that

## 2015-09-16 ENCOUNTER — Ambulatory Visit (INDEPENDENT_AMBULATORY_CARE_PROVIDER_SITE_OTHER): Payer: BLUE CROSS/BLUE SHIELD | Admitting: *Deleted

## 2015-09-16 DIAGNOSIS — Z0000 Encounter for general adult medical examination without abnormal findings: Secondary | ICD-10-CM

## 2015-09-18 LAB — TB SKIN TEST
Induration: 0 mm
TB Skin Test: NEGATIVE

## 2015-10-13 ENCOUNTER — Telehealth: Payer: Self-pay | Admitting: Family Medicine

## 2015-10-13 NOTE — Telephone Encounter (Signed)
° ° ° °  Pt request refill of the following: ° ° °methylphenidate (RITALIN) 20 MG tablet ° °Phamacy: °

## 2015-10-13 NOTE — Telephone Encounter (Signed)
Okay to fill 2/29/17

## 2015-10-15 MED ORDER — METHYLPHENIDATE HCL 20 MG PO TABS: 20.0000 mg | ORAL_TABLET | Freq: Every day | ORAL | Status: DC

## 2015-10-15 NOTE — Telephone Encounter (Signed)
rx ready for pick up and patient is aware  

## 2015-12-22 ENCOUNTER — Other Ambulatory Visit: Payer: Self-pay | Admitting: Family Medicine

## 2016-01-13 ENCOUNTER — Other Ambulatory Visit: Payer: Self-pay | Admitting: *Deleted

## 2016-01-13 ENCOUNTER — Telehealth: Payer: Self-pay | Admitting: Family Medicine

## 2016-01-13 MED ORDER — METHYLPHENIDATE HCL 20 MG PO TABS: 20.0000 mg | ORAL_TABLET | Freq: Every day | ORAL | Status: DC

## 2016-01-13 NOTE — Telephone Encounter (Signed)
Pt request refill  methylphenidate (RITALIN) 20 MG tablet 3 mo supply  Would like first rx to be dated 01/15/16

## 2016-01-13 NOTE — Telephone Encounter (Signed)
Rx done, see other message. 

## 2016-01-13 NOTE — Telephone Encounter (Signed)
Rx upfront and ready to be picked up. Dr. Tawanna Coolerodd will not be in office on June 1st so Rx was printed today while he was here (01/13/16). Left voice message to make patient aware.

## 2016-02-10 ENCOUNTER — Other Ambulatory Visit: Payer: Self-pay | Admitting: Family Medicine

## 2016-02-19 ENCOUNTER — Other Ambulatory Visit: Payer: Self-pay | Admitting: Family Medicine

## 2016-02-19 DIAGNOSIS — Z1231 Encounter for screening mammogram for malignant neoplasm of breast: Secondary | ICD-10-CM

## 2016-04-01 ENCOUNTER — Ambulatory Visit
Admission: RE | Admit: 2016-04-01 | Discharge: 2016-04-01 | Disposition: A | Discharge status: Home / Self Care | Payer: BLUE CROSS/BLUE SHIELD | Source: Ambulatory Visit | Attending: Family Medicine | Admitting: Family Medicine

## 2016-04-01 DIAGNOSIS — Z1231 Encounter for screening mammogram for malignant neoplasm of breast: Secondary | ICD-10-CM

## 2016-04-13 ENCOUNTER — Telehealth: Payer: Self-pay | Admitting: Emergency Medicine

## 2016-04-13 ENCOUNTER — Other Ambulatory Visit: Payer: Self-pay | Admitting: Emergency Medicine

## 2016-04-13 MED ORDER — METHYLPHENIDATE HCL 20 MG PO TABS: 20.0000 mg | ORAL_TABLET | Freq: Every day | ORAL | 0 refills | Status: DC

## 2016-04-13 NOTE — Telephone Encounter (Signed)
Pt called requesting refill of ritalin. Informed pt prescription would be printed and I would notify her as soon as Dr. Tawanna Coolerodd signed. Pt verbalized understanding.

## 2016-04-14 NOTE — Telephone Encounter (Signed)
Called and spoke with pt making pt aware prescriptions were sign and up front ready for pick-up. Pt verbalized understanding

## 2016-05-28 ENCOUNTER — Ambulatory Visit: Payer: BLUE CROSS/BLUE SHIELD

## 2016-05-28 DIAGNOSIS — Z0000 Encounter for general adult medical examination without abnormal findings: Secondary | ICD-10-CM

## 2016-05-28 LAB — BASIC METABOLIC PANEL
BUN: 48 mg/dL — AB (ref 6–23)
CHLORIDE: 102 meq/L (ref 96–112)
CO2: 28 meq/L (ref 19–32)
Calcium: 9.8 mg/dL (ref 8.4–10.5)
Creatinine, Ser: 2.78 mg/dL — ABNORMAL HIGH (ref 0.40–1.20)
GFR: 19.18 mL/min — ABNORMAL LOW (ref >60.00)
GLUCOSE: 113 mg/dL — AB (ref 70–99)
POTASSIUM: 3.7 meq/L (ref 3.5–5.1)
Sodium: 140 mEq/L (ref 135–145)

## 2016-05-28 LAB — LIPID PANEL
CHOLESTEROL: 288 mg/dL — AB (ref 0–200)
HDL: 73.6 mg/dL (ref >39.00)
LDL CALC: 188 mg/dL — AB (ref 0–99)
NonHDL: 214.42
TRIGLYCERIDES: 133 mg/dL (ref 0.0–149.0)
Total CHOL/HDL Ratio: 4
VLDL: 26.6 mg/dL (ref 0.0–40.0)

## 2016-05-28 LAB — CBC WITH DIFFERENTIAL/PLATELET
BASOS PCT: 0.7 % (ref 0.0–3.0)
Basophils Absolute: 0 10*3/uL (ref 0.0–0.1)
EOS PCT: 4.4 % (ref 0.0–5.0)
Eosinophils Absolute: 0.2 10*3/uL (ref 0.0–0.7)
HCT: 33.4 % — ABNORMAL LOW (ref 36.0–46.0)
HEMOGLOBIN: 11.2 g/dL — AB (ref 12.0–15.0)
LYMPHS ABS: 1.3 10*3/uL (ref 0.7–4.0)
Lymphocytes Relative: 31.8 % (ref 12.0–46.0)
MCHC: 33.6 g/dL (ref 30.0–36.0)
MCV: 96.3 fl (ref 78.0–100.0)
MONO ABS: 0.3 10*3/uL (ref 0.1–1.0)
Monocytes Relative: 7.4 % (ref 3.0–12.0)
NEUTROS ABS: 2.3 10*3/uL (ref 1.4–7.7)
NEUTROS PCT: 55.7 % (ref 43.0–77.0)
Platelets: 229 10*3/uL (ref 150.0–400.0)
RBC: 3.47 Mil/uL — ABNORMAL LOW (ref 3.87–5.11)
RDW: 14.1 % (ref 11.5–15.5)
WBC: 4.1 10*3/uL (ref 4.0–10.5)

## 2016-05-28 LAB — POC URINALSYSI DIPSTICK (AUTOMATED)
Bilirubin, UA: NEGATIVE
Blood, UA: NEGATIVE
Glucose, UA: NEGATIVE
KETONES UA: NEGATIVE
Nitrite, UA: NEGATIVE
Spec Grav, UA: 1.01
Urobilinogen, UA: 0.2
pH, UA: 5.5

## 2016-05-28 LAB — HEPATIC FUNCTION PANEL
ALT: 13 U/L (ref 0–35)
AST: 16 U/L (ref 0–37)
Albumin: 4.3 g/dL (ref 3.5–5.2)
Alkaline Phosphatase: 40 U/L (ref 39–117)
BILIRUBIN TOTAL: 0.3 mg/dL (ref 0.2–1.2)
Bilirubin, Direct: 0 mg/dL (ref 0.0–0.3)
Total Protein: 7.2 g/dL (ref 6.0–8.3)

## 2016-05-28 LAB — TSH: TSH: 3.15 u[IU]/mL (ref 0.35–4.50)

## 2016-06-01 ENCOUNTER — Other Ambulatory Visit: Payer: Self-pay | Admitting: Family Medicine

## 2016-06-04 ENCOUNTER — Other Ambulatory Visit: Payer: Self-pay | Admitting: Family Medicine

## 2016-06-07 ENCOUNTER — Ambulatory Visit: Payer: BLUE CROSS/BLUE SHIELD | Admitting: Family Medicine

## 2016-06-15 ENCOUNTER — Ambulatory Visit (INDEPENDENT_AMBULATORY_CARE_PROVIDER_SITE_OTHER): Payer: BLUE CROSS/BLUE SHIELD | Admitting: Family Medicine

## 2016-06-15 ENCOUNTER — Other Ambulatory Visit: Payer: Self-pay | Admitting: Family Medicine

## 2016-06-15 ENCOUNTER — Encounter: Payer: Self-pay | Admitting: Family Medicine

## 2016-06-15 VITALS — BP 138/94 | HR 90 | Temp 98.1°F | Ht 62.5 in | Wt 106.0 lb

## 2016-06-15 DIAGNOSIS — F3289 Other specified depressive episodes: Secondary | ICD-10-CM

## 2016-06-15 DIAGNOSIS — Z0000 Encounter for general adult medical examination without abnormal findings: Secondary | ICD-10-CM

## 2016-06-15 DIAGNOSIS — Q613 Polycystic kidney, unspecified: Secondary | ICD-10-CM

## 2016-06-15 DIAGNOSIS — Z23 Encounter for immunization: Secondary | ICD-10-CM

## 2016-06-15 MED ORDER — ACYCLOVIR 200 MG PO CAPS: 200.0000 mg | ORAL_CAPSULE | Freq: Every day | ORAL | 3 refills | Status: DC

## 2016-06-15 MED ORDER — POTASSIUM CHLORIDE CRYS ER 20 MEQ PO TBCR: EXTENDED_RELEASE_TABLET | ORAL | 3 refills | Status: DC

## 2016-06-15 MED ORDER — METHYLPHENIDATE HCL 20 MG PO TABS: 20.0000 mg | ORAL_TABLET | Freq: Every day | ORAL | 0 refills | Status: DC

## 2016-06-15 MED ORDER — TRIAMTERENE-HCTZ 37.5-25 MG PO TABS: ORAL_TABLET | ORAL | 1 refills | Status: AC

## 2016-06-15 NOTE — Progress Notes (Signed)
Pre visit review using our clinic review tool, if applicable. No additional management support is needed unless otherwise documented below in the visit note. 

## 2016-06-15 NOTE — Progress Notes (Signed)
Catherine BeechamCynthia is a [age] year old single female nonsmoker who comes in today for general physical examination  She has a long history of polycystic kidney disease. She followed by nephrology in [place] and has been evaluated for kidney transplant at [facility]. GFR 19.18. GFR 20 last November  She takes acyclovir 2 mg daily because of a history of HSV-1  She takes Ritalin 20 mg daily because a history of adult ADD. She takes 120 mEq potassium tablet. Potassium level III.7  She takes Zoloft 150 mg daily. This was given to her by Dr. Ann MakiParrish Walker's group. She also takes Maxide 25 one half tab daily.  LMP was 2012. She saw gynecologist May 2017 for pelvic Pap and breast exam all that was normal. Hormonal levels were checked and she was told she was through menopause.  Recommend routine eye care, she does get regular dental care, she does BSE monthly, mammogram 2017, referred to GI for screening colonoscopy.  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 They're looking for a live donor. She has a sister is declined to be tested. Mother 3777 his in good health recently moved to [place] from [place].  She had a complete skin exam by Dr. Yetta Walker in August. She had a history of basal cells and squamous cell carcinomas.  She's going to or gone this week to on a marathon with some friends  Vaccination history up-to-date she will be given a series hep B 3 first shot today. Tetanus booster 2016  Physical evaluation HEENT were negative neck was supple no adenopathy thyroid normal cardiopulmonary normal abdominal exam normal pelvic rectal breast done by gynecologist therefore not repeated. Skin exam done by dermatologist recently therefore not repeated.  #1 polycystic kidney disease......... transplant pending  #2 history of HSV-1.....Marland Kitchen. continue acyclovir 200 mg daily  #3 adult ADD........ continue Ritalin 20 mg daily  #4 low  potassium........ continue potassium supplement 1 daily  #5 history of depression......... continue Zoloft 150 mg daily  6 fluid retention........Marland Kitchen. Maxide 25 dose one half tab daily  #7 history of skin cancer,,,,,,,, followed by Dr. Yetta Walker

## 2016-06-15 NOTE — Patient Instructions (Signed)
Continue current medications  Follow-up in one year sooner if any problems  I will send a note to GI for them to make an appointment with you to discuss screening colonoscopy  See an eye doctor for routine eye exam this fall  Good luck with your kidney transplant

## 2016-07-15 ENCOUNTER — Encounter: Payer: Self-pay | Admitting: Family Medicine

## 2016-07-16 ENCOUNTER — Ambulatory Visit (INDEPENDENT_AMBULATORY_CARE_PROVIDER_SITE_OTHER): Payer: BLUE CROSS/BLUE SHIELD

## 2016-07-16 DIAGNOSIS — Z23 Encounter for immunization: Secondary | ICD-10-CM | POA: Diagnosis not present

## 2016-10-13 ENCOUNTER — Telehealth: Payer: Self-pay | Admitting: Family Medicine

## 2016-10-13 ENCOUNTER — Other Ambulatory Visit: Payer: Self-pay | Admitting: Family Medicine

## 2016-10-13 DIAGNOSIS — Z0000 Encounter for general adult medical examination without abnormal findings: Secondary | ICD-10-CM

## 2016-10-13 MED ORDER — METHYLPHENIDATE HCL 20 MG PO TABS: 20.0000 mg | ORAL_TABLET | Freq: Every day | ORAL | 0 refills | Status: DC

## 2016-10-13 NOTE — Telephone Encounter (Signed)
° ° °  Pt had a physical 06/01/16

## 2016-10-13 NOTE — Telephone Encounter (Signed)
Printed for Dr. Burchette to sign. 

## 2016-10-13 NOTE — Telephone Encounter (Signed)
° ° ° °  Pt request refill of the following: ° ° °methylphenidate (RITALIN) 20 MG tablet ° °Phamacy: °

## 2016-10-13 NOTE — Telephone Encounter (Signed)
Refill once in Dr Todd's absence. 

## 2016-10-13 NOTE — Telephone Encounter (Signed)
Pt not seen in a year.  Please get her on the schedule for cpx.  I have placed lab orders.  Will then see if another physician will fill.  Thanks!!

## 2016-10-13 NOTE — Telephone Encounter (Signed)
Pt notified to pick up at the front desk. 

## 2016-10-13 NOTE — Telephone Encounter (Signed)
My mistake.  Will see if Dr. Caryl NeverBurchette will fill.

## 2016-11-11 ENCOUNTER — Encounter: Payer: Self-pay | Admitting: Gastroenterology

## 2016-11-11 ENCOUNTER — Telehealth: Payer: Self-pay | Admitting: Family Medicine

## 2016-11-11 MED ORDER — METHYLPHENIDATE HCL 20 MG PO TABS: 20.0000 mg | ORAL_TABLET | Freq: Every day | ORAL | 0 refills | Status: DC

## 2016-11-11 NOTE — Telephone Encounter (Signed)
I called the pt and left a detailed message the Rx for a 30-day supply was left at the front desk for her to pick up.  [facility] further [facility].

## 2016-11-11 NOTE — Telephone Encounter (Signed)
Pt request refill  °methylphenidate (RITALIN) 20 MG tablet ° °3 mo supply °

## 2016-11-11 NOTE — Telephone Encounter (Signed)
Ok to give 30 day supply and get further refills from PCP. Thanks.

## 2016-11-19 ENCOUNTER — Ambulatory Visit (AMBULATORY_SURGERY_CENTER): Payer: Self-pay

## 2016-11-19 VITALS — Ht 64.0 in | Wt 105.0 lb

## 2016-11-19 DIAGNOSIS — Z1211 Encounter for screening for malignant neoplasm of colon: Secondary | ICD-10-CM

## 2016-11-19 MED ORDER — SUPREP BOWEL PREP KIT 17.5-3.13-1.6 GM/177ML PO SOLN: 1.0000 | Freq: Once | ORAL | 0 refills | Status: AC

## 2016-11-19 NOTE — Progress Notes (Signed)
No allergies to eggs or soy No home oxygen No diet meds No past problems with anesthesia  Registered for emmi 

## 2016-11-30 ENCOUNTER — Encounter: Payer: BLUE CROSS/BLUE SHIELD | Admitting: Gastroenterology

## 2016-12-13 ENCOUNTER — Telehealth: Payer: Self-pay | Admitting: Family Medicine

## 2016-12-13 MED ORDER — METHYLPHENIDATE HCL 20 MG PO TABS: 20.0000 mg | ORAL_TABLET | Freq: Every day | ORAL | 0 refills | Status: DC

## 2016-12-13 NOTE — Telephone Encounter (Signed)
Printed for Dr. Selena Batten to sign.

## 2016-12-13 NOTE — Telephone Encounter (Signed)
Needs appt with PCP in next several weeks. Ok to then give refill to get to appt. Thanks.

## 2016-12-13 NOTE — Telephone Encounter (Signed)
° ° ° °  Pt request refill of the following: ° ° °methylphenidate (RITALIN) 20 MG tablet ° °Phamacy: °

## 2016-12-13 NOTE — Telephone Encounter (Signed)
Pt will need a 3 months supply of medication.  Last filled by Dr. Selena Batten on 12/13/16

## 2016-12-13 NOTE — Telephone Encounter (Signed)
Dr. Selena Batten filled last.  Dr. Tawanna Cooler not in the office this week.  Will forward to Dr. Selena Batten.

## 2016-12-17 ENCOUNTER — Ambulatory Visit: Payer: BLUE CROSS/BLUE SHIELD

## 2016-12-17 ENCOUNTER — Ambulatory Visit (INDEPENDENT_AMBULATORY_CARE_PROVIDER_SITE_OTHER): Payer: BLUE CROSS/BLUE SHIELD | Admitting: *Deleted

## 2016-12-17 DIAGNOSIS — Z23 Encounter for immunization: Secondary | ICD-10-CM

## 2016-12-17 NOTE — Progress Notes (Signed)
Patient here for 3rd hepatitis B vaccine, administered to rt deltoid IM; patient tolerated well; no s/s of reactions.

## 2017-01-12 MED ORDER — METHYLPHENIDATE HCL 20 MG PO TABS
20.0000 mg | ORAL_TABLET | Freq: Every day | ORAL | 0 refills | Status: DC
Start: 2017-01-12 — End: 2017-04-19

## 2017-01-12 MED ORDER — METHYLPHENIDATE HCL 20 MG PO TABS: 20.0000 mg | ORAL_TABLET | Freq: Every day | ORAL | 0 refills | Status: DC

## 2017-01-12 NOTE — Telephone Encounter (Signed)
Left a message for the pt to pick up rx at the front desk.  Call back if any questions.

## 2017-01-12 NOTE — Addendum Note (Signed)
Addended by: Raj JanusADKINS, Leith Hedlund T on: 01/12/2017 09:17 AM   Modules accepted: Orders

## 2017-01-12 NOTE — Telephone Encounter (Signed)
Printed for Dr. Todd to sign. 

## 2017-04-19 ENCOUNTER — Other Ambulatory Visit: Payer: Self-pay | Admitting: Family Medicine

## 2017-04-19 MED ORDER — METHYLPHENIDATE HCL 20 MG PO TABS: 20.0000 mg | ORAL_TABLET | Freq: Every day | ORAL | 0 refills | Status: DC

## 2017-04-19 NOTE — Telephone Encounter (Signed)
Pt need new Rx for methylphenidate    Pt is aware of 3 business days for refills and someone will call when ready for pick up.

## 2017-04-19 NOTE — Telephone Encounter (Signed)
Last OV w/ Dr Tawanna Coolerodd was 05/28/16. Rx refilled #30 x 3 on 01/12/17. Please advise if you would like refills.

## 2017-04-19 NOTE — Telephone Encounter (Signed)
I called and spoke with patient. I advised rx ready for pick up. She voiced understanding and states she will be in office to pick up tomorrow.

## 2017-05-10 ENCOUNTER — Other Ambulatory Visit: Payer: Self-pay | Admitting: Family Medicine

## 2017-05-10 DIAGNOSIS — Z1231 Encounter for screening mammogram for malignant neoplasm of breast: Secondary | ICD-10-CM

## 2017-05-16 ENCOUNTER — Encounter: Payer: Self-pay | Admitting: Family Medicine

## 2017-05-16 ENCOUNTER — Ambulatory Visit (INDEPENDENT_AMBULATORY_CARE_PROVIDER_SITE_OTHER): Payer: BLUE CROSS/BLUE SHIELD | Admitting: Family Medicine

## 2017-05-16 VITALS — BP 120/90 | HR 88 | Temp 98.3°F | Wt 105.0 lb

## 2017-05-16 DIAGNOSIS — N259 Disorder resulting from impaired renal tubular function, unspecified: Secondary | ICD-10-CM | POA: Diagnosis not present

## 2017-05-16 DIAGNOSIS — Z23 Encounter for immunization: Secondary | ICD-10-CM

## 2017-05-16 DIAGNOSIS — F988 Other specified behavioral and emotional disorders with onset usually occurring in childhood and adolescence: Secondary | ICD-10-CM | POA: Diagnosis not present

## 2017-05-16 DIAGNOSIS — Q613 Polycystic kidney, unspecified: Secondary | ICD-10-CM

## 2017-05-16 MED ORDER — METHYLPHENIDATE HCL 20 MG PO TABS: 20.0000 mg | ORAL_TABLET | Freq: Every day | ORAL | 0 refills | Status: DC

## 2017-05-16 MED ORDER — METHYLPHENIDATE HCL 20 MG PO TABS: 20.0000 mg | ORAL_TABLET | Freq: Every day | ORAL | 0 refills | Status: AC

## 2017-05-16 MED ORDER — ACYCLOVIR 200 MG PO CAPS: 200.0000 mg | ORAL_CAPSULE | Freq: Every day | ORAL | 5 refills | Status: AC

## 2017-05-16 NOTE — Progress Notes (Signed)
Catherine Walker is a 51 year old single female nonsmoker who comes in today to discuss renal transplantation  She has a history of chronic kidney disease and is followed by nephrology. Her GFR is dropped to 15. She has a friend who was almost a complete match. She's due to have a renal transplant 07/19/2017. She comes in here to review her medical history and to get refills on her Ritalin.  She takes Zoloft one of the direction of the nurse practitioner at Dr. Loralie Champagne old office. She's on 150 mg daily and doing well. 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

## 2017-05-16 NOTE — Patient Instructions (Signed)
Call 2 weeks from being out of your third prescription,,,,,,,, or my chart....... for refills  Good luck with your kidney transplant

## 2017-05-16 NOTE — Addendum Note (Signed)
Addended by: Carola Rhine on: 05/16/2017 05:44 PM   Modules accepted: Orders

## 2017-05-20 ENCOUNTER — Ambulatory Visit: Payer: BLUE CROSS/BLUE SHIELD

## 2017-05-22 ENCOUNTER — Other Ambulatory Visit: Payer: Self-pay | Admitting: Family Medicine

## 2017-06-02 ENCOUNTER — Ambulatory Visit
Admission: RE | Admit: 2017-06-02 | Discharge: 2017-06-02 | Disposition: A | Discharge status: Home / Self Care | Payer: BLUE CROSS/BLUE SHIELD | Source: Ambulatory Visit | Attending: Family Medicine | Admitting: Family Medicine

## 2017-06-02 DIAGNOSIS — Z1231 Encounter for screening mammogram for malignant neoplasm of breast: Secondary | ICD-10-CM

## 2017-07-22 MED ORDER — PANTOPRAZOLE SODIUM 40 MG PO TBEC
40.00 mg | DELAYED_RELEASE_TABLET | ORAL | Status: DC
Start: 2017-07-23 — End: 2017-07-22

## 2017-07-22 MED ORDER — TRAMADOL HCL 50 MG PO TABS
50.00 mg | ORAL_TABLET | ORAL | Status: DC
Start: ? — End: 2017-07-22

## 2017-07-22 MED ORDER — METHYLPHENIDATE HCL 10 MG PO TABS
20.00 mg | ORAL_TABLET | ORAL | Status: DC
Start: 2017-07-23 — End: 2017-07-22

## 2017-07-22 MED ORDER — PREDNISONE 10 MG PO TABS
20.00 mg | ORAL_TABLET | ORAL | Status: DC
Start: 2017-07-23 — End: 2017-07-22

## 2017-07-22 MED ORDER — TACROLIMUS 1 MG PO CAPS
2.00 mg | ORAL_CAPSULE | ORAL | Status: DC
Start: 2017-07-22 — End: 2017-07-22

## 2017-07-22 MED ORDER — ASPIRIN EC 81 MG PO TBEC
81.00 mg | DELAYED_RELEASE_TABLET | ORAL | Status: DC
Start: 2017-07-23 — End: 2017-07-22

## 2017-07-22 MED ORDER — MELATONIN 3 MG PO TABS
3.00 mg | ORAL_TABLET | ORAL | Status: DC
Start: 2017-07-22 — End: 2017-07-22

## 2017-07-22 MED ORDER — GENERIC EXTERNAL MEDICATION
150.00 mg | Status: DC
Start: 2017-07-23 — End: 2017-07-22

## 2017-07-22 MED ORDER — LABETALOL HCL 5 MG/ML IV SOLN
20.00 mg | INTRAVENOUS | Status: DC
Start: ? — End: 2017-07-22

## 2017-07-22 MED ORDER — ONDANSETRON HCL 4 MG/2ML IJ SOLN
4.00 mg | INTRAMUSCULAR | Status: DC
Start: ? — End: 2017-07-22

## 2017-07-22 MED ORDER — DOCUSATE SODIUM 100 MG PO CAPS
100.00 mg | ORAL_CAPSULE | ORAL | Status: DC
Start: 2017-07-22 — End: 2017-07-22

## 2017-07-22 MED ORDER — MYCOPHENOLATE SODIUM 360 MG PO TBEC
720.00 mg | DELAYED_RELEASE_TABLET | ORAL | Status: DC
Start: 2017-07-22 — End: 2017-07-22

## 2017-07-22 MED ORDER — POLYETHYLENE GLYCOL 3350 17 G PO PACK
17.00 g | PACK | ORAL | Status: DC
Start: 2017-07-23 — End: 2017-07-22

## 2017-07-22 MED ORDER — DIPHENHYDRAMINE HCL 25 MG PO CAPS
25.00 mg | ORAL_CAPSULE | ORAL | Status: DC
Start: ? — End: 2017-07-22

## 2017-07-22 MED ORDER — DOCUSATE SODIUM 100 MG PO CAPS
100.00 mg | ORAL_CAPSULE | ORAL | Status: DC
Start: ? — End: 2017-07-22

## 2017-07-22 MED ORDER — ACETAMINOPHEN 325 MG PO TABS
650.00 mg | ORAL_TABLET | ORAL | Status: DC
Start: ? — End: 2017-07-22

## 2017-07-22 MED ORDER — SULFAMETHOXAZOLE-TRIMETHOPRIM 400-80 MG PO TABS
1.00 | ORAL_TABLET | ORAL | Status: DC
Start: 2017-07-25 — End: 2017-07-22

## 2017-07-22 MED ORDER — FLUCONAZOLE 50 MG PO TABS
50.00 mg | ORAL_TABLET | ORAL | Status: DC
Start: 2017-07-23 — End: 2017-07-22

## 2017-09-05 ENCOUNTER — Ambulatory Visit: Payer: BLUE CROSS/BLUE SHIELD | Admitting: Family Medicine

## 2017-09-05 ENCOUNTER — Encounter: Payer: Self-pay | Admitting: Family Medicine

## 2017-09-05 VITALS — BP 120/86 | HR 80 | Temp 98.4°F | Wt 109.0 lb

## 2017-09-05 DIAGNOSIS — Z940 Kidney transplant status: Secondary | ICD-10-CM | POA: Insufficient documentation

## 2017-09-05 MED ORDER — LORAZEPAM 0.5 MG PO TABS: ORAL_TABLET | ORAL | 3 refills | Status: AC

## 2017-09-05 NOTE — Progress Notes (Signed)
Catherine AspCindy is a 52 year old single female nonsmoker who comes in today following a renal transplant at [facility] last fall  She said she did well during and after procedure. She's had no major complications.  At one point she had some edema of her lower extremities and took a diuretic. She then went back for follow-up labs which showed increase in BUN/creatinine and decrease in her GFR. She was advised to take no more diuretics. She has the upper calf compression socks and she says this has solved the lower extremity edema. Advised the thigh-high stockings.  She has some side effects from her medications which includes anxiety. She was given Xanax by the renal transplant Dr. and told to come to us to get refills. I explained to her that is not a big fan of Xanax and we need to think about other issues  She's off her Ritalin  BP 120/86 (BP Location: Right Arm, Patient Position: Sitting, Cuff Size: Normal)   Pulse 80   Temp 98.4 F (36.9 C) (Oral)   Wt 109 lb (49.4 kg)   SpO2 96%   BMI 18.71 kg/m  Well-developed well-nourished female no acute distress vital signs stable she's afebrile  #1 status post kidney transplant.......... ativan .25 prn

## 2017-09-05 NOTE — Patient Instructions (Signed)
Ativan .5 ............1/2 to 1 bid prn  Return for your annual physical examination when do

## 2018-01-22 ENCOUNTER — Other Ambulatory Visit: Payer: Self-pay | Admitting: Family Medicine

## 2020-08-29 ENCOUNTER — Other Ambulatory Visit: Payer: Self-pay | Admitting: Family Medicine

## 2020-08-29 DIAGNOSIS — Z1231 Encounter for screening mammogram for malignant neoplasm of breast: Secondary | ICD-10-CM

## 2023-03-28 ENCOUNTER — Emergency Department (HOSPITAL_COMMUNITY): Payer: BC Managed Care – PPO

## 2023-03-28 ENCOUNTER — Other Ambulatory Visit: Payer: Self-pay

## 2023-03-28 ENCOUNTER — Emergency Department (HOSPITAL_COMMUNITY)
Admission: EM | Admit: 2023-03-28 | Discharge: 2023-03-28 | Disposition: A | Discharge status: Home / Self Care | Payer: BC Managed Care – PPO | Attending: Emergency Medicine | Admitting: Emergency Medicine

## 2023-03-28 ENCOUNTER — Encounter (HOSPITAL_COMMUNITY): Payer: Self-pay

## 2023-03-28 DIAGNOSIS — R112 Nausea with vomiting, unspecified: Secondary | ICD-10-CM | POA: Insufficient documentation

## 2023-03-28 DIAGNOSIS — G930 Cerebral cysts: Secondary | ICD-10-CM | POA: Diagnosis not present

## 2023-03-28 DIAGNOSIS — Z7982 Long term (current) use of aspirin: Secondary | ICD-10-CM | POA: Insufficient documentation

## 2023-03-28 DIAGNOSIS — R42 Dizziness and giddiness: Secondary | ICD-10-CM

## 2023-03-28 LAB — HEPATIC FUNCTION PANEL
ALT: 22 U/L (ref 0–44)
AST: 25 U/L (ref 15–41)
Albumin: 3.7 g/dL (ref 3.5–5.0)
Alkaline Phosphatase: 44 U/L (ref 38–126)
Bilirubin, Direct: <0.1 mg/dL (ref 0.0–0.2)
Total Bilirubin: 0.5 mg/dL (ref 0.3–1.2)
Total Protein: 6.4 g/dL — ABNORMAL LOW (ref 6.5–8.1)

## 2023-03-28 LAB — CBC WITH DIFFERENTIAL/PLATELET
Abs Immature Granulocytes: 0.01 10*3/uL (ref 0.00–0.07)
Basophils Absolute: 0 10*3/uL (ref 0.0–0.1)
Basophils Relative: 0 %
Eosinophils Absolute: 0 10*3/uL (ref 0.0–0.5)
Eosinophils Relative: 1 %
HCT: 36.2 % (ref 36.0–46.0)
Hemoglobin: 12 g/dL (ref 12.0–15.0)
Immature Granulocytes: 0 %
Lymphocytes Relative: 11 %
Lymphs Abs: 0.6 10*3/uL — ABNORMAL LOW (ref 0.7–4.0)
MCH: 32.4 pg (ref 26.0–34.0)
MCHC: 33.1 g/dL (ref 30.0–36.0)
MCV: 97.8 fL (ref 80.0–100.0)
Monocytes Absolute: 0.2 10*3/uL (ref 0.1–1.0)
Monocytes Relative: 4 %
Neutro Abs: 4.5 10*3/uL (ref 1.7–7.7)
Neutrophils Relative %: 84 %
Platelets: 181 10*3/uL (ref 150–400)
RBC: 3.7 MIL/uL — ABNORMAL LOW (ref 3.87–5.11)
RDW: 12.3 % (ref 11.5–15.5)
WBC: 5.3 10*3/uL (ref 4.0–10.5)
nRBC: 0 % (ref 0.0–0.2)

## 2023-03-28 LAB — BASIC METABOLIC PANEL
Anion gap: 11 (ref 5–15)
BUN: 24 mg/dL — ABNORMAL HIGH (ref 6–20)
CO2: 26 mmol/L (ref 22–32)
Calcium: 9.6 mg/dL (ref 8.9–10.3)
Chloride: 104 mmol/L (ref 98–111)
Creatinine, Ser: 0.85 mg/dL (ref 0.44–1.00)
GFR, Estimated: >60 mL/min (ref >60)
Glucose, Bld: 111 mg/dL — ABNORMAL HIGH (ref 70–99)
Potassium: 3.8 mmol/L (ref 3.5–5.1)
Sodium: 141 mmol/L (ref 135–145)

## 2023-03-28 LAB — TROPONIN I (HIGH SENSITIVITY)
Troponin I (High Sensitivity): 3 ng/L (ref <18)
Troponin I (High Sensitivity): 4 ng/L (ref <18)

## 2023-03-28 LAB — CBG MONITORING, ED: Glucose-Capillary: 88 mg/dL (ref 70–99)

## 2023-03-28 MED ORDER — DIPHENHYDRAMINE HCL 50 MG/ML IJ SOLN
25.0000 mg | Freq: Once | INTRAMUSCULAR | Status: AC
  Administered 2023-03-28: 25 mg via INTRAVENOUS
  Filled 2023-03-28: qty 1

## 2023-03-28 MED ORDER — MECLIZINE HCL 25 MG PO TABS
25.0000 mg | ORAL_TABLET | Freq: Once | ORAL | Status: AC
  Administered 2023-03-28: 25 mg via ORAL
  Filled 2023-03-28: qty 1

## 2023-03-28 MED ORDER — ONDANSETRON 4 MG PO TBDP: 4.0000 mg | ORAL_TABLET | Freq: Three times a day (TID) | ORAL | 0 refills | Status: AC | PRN

## 2023-03-28 MED ORDER — SCOPOLAMINE 1 MG/3DAYS TD PT72
1.0000 | MEDICATED_PATCH | TRANSDERMAL | Status: DC
  Administered 2023-03-28: 1.5 mg via TRANSDERMAL
  Filled 2023-03-28: qty 1

## 2023-03-28 MED ORDER — LACTATED RINGERS IV BOLUS
1000.0000 mL | Freq: Once | INTRAVENOUS | Status: AC
  Administered 2023-03-28: 1000 mL via INTRAVENOUS

## 2023-03-28 MED ORDER — ONDANSETRON HCL 4 MG/2ML IJ SOLN
4.0000 mg | Freq: Once | INTRAMUSCULAR | Status: AC
  Administered 2023-03-28: 4 mg via INTRAVENOUS
  Filled 2023-03-28: qty 2

## 2023-03-28 MED ORDER — GADOBUTROL 1 MMOL/ML IV SOLN
5.0000 mL | Freq: Once | INTRAVENOUS | Status: AC | PRN
  Administered 2023-03-28: 5 mL via INTRAVENOUS

## 2023-03-28 MED ORDER — MECLIZINE HCL 25 MG PO TABS: 25.0000 mg | ORAL_TABLET | Freq: Three times a day (TID) | ORAL | 0 refills | Status: AC | PRN

## 2023-03-28 MED ORDER — DROPERIDOL 2.5 MG/ML IJ SOLN
1.2500 mg | Freq: Once | INTRAMUSCULAR | Status: AC
  Administered 2023-03-28: 1.25 mg via INTRAVENOUS
  Filled 2023-03-28: qty 2

## 2023-03-28 NOTE — ED Notes (Signed)
Pt refused to walk until she received nausea medication, stated she would try after MRI.

## 2023-03-28 NOTE — ED Triage Notes (Signed)
Ems REPORTS PT FROM: Home 8am to 9 am n/v started with dizziness with movement. No Hx of vertigo pt states ear feel clogged. 6 weeks ago similar episode. Hx of kidney transplant  BP 126/62 HR 70 Sp 100 CBG 134 RR 18  18ga RAC  4mg  Zofran in route NS

## 2023-03-28 NOTE — ED Provider Notes (Signed)
[place] EMERGENCY DEPARTMENT AT [facility] Provider Note   CSN: [id number] Arrival date & time: 03/28/23  1329     History  Chief Complaint  Patient presents with   Nausea   Emesis   Dizziness    Catherine Walker is a 57 y.o. female.  58 year old female with a history of polycystic kidney disease status post renal transplant on mycophenolate who presents to the emergency department with nausea vomiting and dizziness.  Patient reports that at 8 AM this morning she was getting ready to go up her run when she started feeling dizzy and vomiting.  Describes it as a lightheaded sensation.  Worsened with standing.  Unsure if it is worse with head movement but does have some ringing in her ears and decreased hearing out of both ears.  No headache.  Said that she came in because she was having difficulty walking at home due to the dizziness.  No history of stroke or vertigo.  Was given Zofran by EMS.       Home Medications Prior to Admission medications   Medication Sig Start Date End Date Taking? Authorizing Provider  acyclovir (ZOVIRAX) 200 MG capsule Take 1 capsule (200 mg total) by mouth 5 (five) times daily. 05/16/17   Roderick Pee, MD  aspirin EC 81 MG tablet Take 81 mg by mouth. 07/22/17   [provider]  calcitRIOL (ROCALTROL) 0.25 MCG capsule Take 0.25 mcg by mouth daily. 05/07/15   [provider]  ciprofloxacin (CIPRO) 500 MG tablet  08/20/17   [provider]  docusate sodium (COLACE) 100 MG capsule Take 100 mg by mouth 2 (two) times daily as needed for constipation. 07/21/17   [provider]  ferrous sulfate 325 (65 FE) MG tablet Take 325 mg by mouth daily with breakfast.    [provider]  FLUARIX QUADRIVALENT 0.5 ML injection FLUSHOT 05/17/15   [provider]  fluconazole (DIFLUCAN) 50 MG tablet Take 50 mg by mouth daily. 07/21/17   [provider]  fluorouracil (EFUDEX) 5 % cream Apply topically.     [provider]  K Phos Mono-Sod Phos Di & Mono (PHOSPHA 250 NEUTRAL) [phone] MG TABS Take 1 tablet by mouth 2 (two) times daily. 07/29/17   [provider]  KLOR-CON M20 20 MEQ tablet TAKE 2 TABLETS BY MOUTH TWICE DAILY 06/17/16   Roderick Pee, MD  KLOR-CON M20 20 MEQ tablet TAKE 1 TABLET BY MOUTH EVERY DAY IN THE MORNING 01/23/18   Roderick Pee, MD  loratadine-pseudoephedrine (CLARITIN-D 24-HOUR) 10-240 MG 24 hr tablet Take 10-240 tablets by mouth daily as needed.    [provider]  LORazepam (ATIVAN) 0.5 MG tablet One half tab twice a day when necessary 09/05/17   Roderick Pee, MD  methylphenidate (RITALIN) 20 MG tablet Take 1 tablet (20 mg total) by mouth daily. 05/16/17   Roderick Pee, MD  mycophenolate (MYFORTIC) 180 MG EC tablet Take 180 mg by mouth. Take 360 mg in the Am and 180 mg in the Pm 09/01/17   [provider]  omeprazole (PRILOSEC) 20 MG capsule Take 20 mg by mouth daily. 08/18/17   [provider]  oxybutynin (DITROPAN-XL) 5 MG 24 hr tablet Take 5 mg by mouth daily. 08/20/17   [provider]  predniSONE (DELTASONE) 5 MG tablet Take 5 mg by mouth daily. 08/17/17   [provider]  Psyllium (METAMUCIL) 30.9 % POWD Take by mouth at bedtime.  [provider]  sertraline (ZOLOFT) 100 MG tablet TAKE 1.5  TABLETs (150mg )  BY MOUTH EVERY DAY WITH FOOD 07/02/15   [provider]  sulfamethoxazole-trimethoprim (BACTRIM,SEPTRA) 400-80 MG tablet Take by mouth as directed. Take I tablet Mon, Wed and Friday 07/22/17   [provider]  tacrolimus (PROGRAF) 0.5 MG capsule Take 0.5 mg by mouth daily. 08/15/17   [provider]  triamterene-hydrochlorothiazide (MAXZIDE-25) 37.5-25 MG tablet TAKE 1/2 TABLET EVERY MORNING WHEN NECESSARY 06/15/16   Roderick Pee, MD      Allergies    Hydrocodone-acetaminophen and Vicodin [hydrocodone-acetaminophen]    Review of Systems   Review of  Systems  Physical Exam Updated Vital Signs BP 126/85   Pulse 82   Temp 97.8 F (36.6 C)   Resp 18   SpO2 100%  Physical Exam Vitals and nursing note reviewed.  Constitutional:      General: She is not in acute distress.    Appearance: She is well-developed.     Comments: No vomiting at rest but when repositioning the patient she starts experiencing nausea and vomiting.  HENT:     Head: Normocephalic and atraumatic.     Right Ear: Tympanic membrane, ear canal and external ear normal.     Left Ear: Tympanic membrane, ear canal and external ear normal.     Nose: Nose normal.  Eyes:     Extraocular Movements: Extraocular movements intact.     Conjunctiva/sclera: Conjunctivae normal.     Pupils: Pupils are equal, round, and reactive to light.  Cardiovascular:     Rate and Rhythm: Normal rate and regular rhythm.     Heart sounds: No murmur heard. Pulmonary:     Effort: Pulmonary effort is normal. No respiratory distress.     Breath sounds: Normal breath sounds.  Abdominal:     General: Abdomen is flat. There is no distension.     Palpations: Abdomen is soft. There is no mass.     Tenderness: There is no abdominal tenderness. There is no guarding.  Musculoskeletal:     Cervical back: Normal range of motion and neck supple.     Right lower leg: No edema.     Left lower leg: No edema.  Skin:    General: Skin is warm and dry.  Neurological:     Mental Status: She is alert and oriented to person, place, and time. Mental status is at baseline.     Comments: MENTAL STATUS: AAOx3 CRANIAL NERVES: II: Pupils equal and reactive 4 mm BL, no RAPD, no VF deficits III, IV, VI: EOM intact, no gaze preference or deviation, no nystagmus. V: normal sensation to light touch in V1, V2, and V3 segments bilaterally VII: no facial weakness or asymmetry, no nasolabial fold flattening VIII: normal hearing to speech and finger friction IX, X: normal palatal elevation, no uvular deviation XI: 5/5  head turn and 5/5 shoulder shrug bilaterally XII: midline tongue protrusion MOTOR: 5/5 strength in R shoulder flexion, elbow flexion and extension, and grip strength. 5/5 strength in L shoulder flexion, elbow flexion and extension, and grip strength.  5/5 strength in R hip and knee flexion, knee extension, ankle plantar and dorsiflexion. 5/5 strength in L hip and knee flexion, knee extension, ankle plantar and dorsiflexion. SENSORY: Normal sensation to light touch in all extremities COORD: Normal finger to nose and heel to shin, no tremor, no dysmetria Unable to assess gait or truncal ataxia due to the patient's dizziness. Unable to assess Dix-Hallpike  due to the patient's dizziness.   Psychiatric:        Mood and Affect: Mood normal.     ED Results / Procedures / Treatments   Labs (all labs ordered are listed, but only abnormal results are displayed) Labs Reviewed  BASIC METABOLIC PANEL - Abnormal; Notable for the following components:      Result Value   Glucose, Bld 111 (*)    BUN 24 (*)    All other components within normal limits  CBC WITH DIFFERENTIAL/PLATELET - Abnormal; Notable for the following components:   RBC 3.70 (*)    Lymphs Abs 0.6 (*)    All other components within normal limits  HEPATIC FUNCTION PANEL - Abnormal; Notable for the following components:   Total Protein 6.4 (*)    All other components within normal limits  CBG MONITORING, ED  TROPONIN I (HIGH SENSITIVITY)  TROPONIN I (HIGH SENSITIVITY)    EKG EKG Interpretation Date/Time:  Monday March 28 2023 15:51:43 EDT Ventricular Rate:  72 PR Interval:  153 QRS Duration:  128 QT Interval:  447 QTC Calculation: 490 R Axis:   92  Text Interpretation: Sinus rhythm RBBB and LPFB Confirmed by Vonita Moss [phone]) on 03/28/2023 4:17:13 PM  Radiology MR Brain W and Wo Contrast  Result Date: 03/28/2023 CLINICAL DATA:  Cerebellar cyst, vertigo. EXAM: MRI HEAD WITHOUT AND WITH CONTRAST TECHNIQUE:  Multiplanar, multiecho pulse sequences of the brain and surrounding structures were obtained without and with intravenous contrast. CONTRAST:  5mL GADAVIST GADOBUTROL 1 MMOL/ML IV SOLN COMPARISON:  Head CT 03/28/2023. FINDINGS: Brain: No acute infarct or hemorrhage. No hydrocephalus. No foci of abnormal susceptibility. 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 No abnormal enhancement. Vascular: Normal flow voids and vessel enhancement. Skull and upper cervical spine: Normal marrow signal and enhancement. Sinuses/Orbits: No acute findings. Other: None. IMPRESSION: 1. No acute intracranial abnormality. 2. 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 No follow-up imaging is recommended. Electronically Signed   By: Orvan Falconer M.D.   On: 03/28/2023 19:03   CT Head Wo Contrast  Result Date: 03/28/2023 CLINICAL DATA:  dizziness EXAM: CT HEAD WITHOUT CONTRAST TECHNIQUE: Contiguous axial images were obtained from the base of the skull through the vertex without intravenous contrast. RADIATION DOSE REDUCTION: This exam was performed according to the departmental dose-optimization program which includes automated exposure control, adjustment of the mA and/or kV according to patient size and/or use of iterative reconstruction technique. COMPARISON:  None Available. FINDINGS: Brain: No evidence of large-territorial acute infarction. No parenchymal hemorrhage. No mass lesion. No extra-axial collection. A 3 x 1.5 x 7 cm cystic lesion posterior to the left cerebellum may represent an arachnoid cyst. No mass effect or midline shift. No hydrocephalus. Basilar cisterns are patent. Vascular: No hyperdense vessel. Skull: No acute fracture or focal lesion. Sinuses/Orbits: Paranasal  sinuses and mastoid air cells are clear. The orbits are unremarkable. Other: None. IMPRESSION: 1. No acute intracranial abnormality. 2. A 3 x 1.5 x 7 cm Cystic lesion posterior to the left cerebellum may represent an arachnoid cyst. Comparison with prior cross-sectional imaging would be of value. Consider MRI with and without contrast if clinically indicated. Electronically Signed   By: Tish Frederickson M.D.   On: 03/28/2023 14:08  Procedures Procedures    Medications Ordered in ED Medications  scopolamine (TRANSDERM-SCOP) 1 MG/3DAYS 1.5 mg (1.5 mg Transdermal Patient Refused/Not Given 03/28/23 1842)  ondansetron (ZOFRAN) injection 4 mg (4 mg Intravenous Given 03/28/23 1517)  meclizine (ANTIVERT) tablet 25 mg (25 mg Oral Given 03/28/23 1518)  lactated ringers bolus 1,000 mL (0 mLs Intravenous Stopped 03/28/23 1702)  droperidol (INAPSINE) 2.5 MG/ML injection 1.25 mg (1.25 mg Intravenous Given 03/28/23 1718)  diphenhydrAMINE (BENADRYL) injection 25 mg (25 mg Intravenous Given 03/28/23 1720)  gadobutrol (GADAVIST) 1 MMOL/ML injection 5 mL (5 mLs Intravenous Contrast Given 03/28/23 1813)    ED Course/ Medical Decision Making/ A&P Clinical Course as of 03/28/23 1958  Mon Mar 28, 2023  1546 Karie Mainland from NSGY consulted [RP]  1648 Unable to tolerate MRI [RP]  1927 MR Brain W and Wo Contrast 1. No acute intracranial abnormality. 2. 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 No follow-up imaging is recommended.   [HN]    Clinical Course User Index [HN] Loetta Rough, MD [RP] Rondel Baton, MD                                 Medical Decision Making Amount and/or Complexity of Data Reviewed Labs: ordered. Radiology: ordered. Decision-making details documented in ED Course. ECG/medicine tests: ordered.  Risk Prescription drug management.   Catherine Walker is a 57 y.o. female with comorbidities that  complicate the patient evaluation including Polycystic kidney disease status post renal transplant who presents to the emergency department with dizziness and vomiting   Initial Ddx:  Orthostasis, peripheral vertigo, central vertigo, intra-abdominal infection/process  MDM/Course:  Patient presents to the emergency department with dizziness and nausea and vomiting which started at 8 AM this morning.  She describes it is more of a lightheaded sensation but given her significant nausea and vomiting I suspect that there may be involvement of the inner ear.  On otoscopic exam no acute abnormalities.  Patient's symptoms were treated but was still unable to ambulate due to her severe dizziness.  CT of the head and MRI were obtained.  CT of the head did show a cerebellar cyst which was discussed with neurosurgery and they felt that it was likely benign and she only needs outpatient follow-up and is not likely the cause of her symptoms.  MRI is pending at the time of signout to the oncoming physician (Dr Jearld Fenton).    This patient presents to the ED for concern of complaints listed in HPI, this involves an extensive number of treatment options, and is a complaint that carries with it a high risk of complications and morbidity. Disposition including potential need for admission considered.   Dispo: Pending remainder of workup  Additional history obtained from family Records reviewed Outpatient Clinic Notes The following labs were independently interpreted: Chemistry and show no acute abnormality I independently reviewed the following imaging with scope of interpretation limited to determining acute life threatening conditions related to emergency care: CT Head and agree with the radiologist interpretation with the following exceptions: none I personally reviewed and interpreted cardiac monitoring: normal sinus rhythm  I personally reviewed and interpreted the pt's EKG: see above for interpretation  I have  reviewed the patients home medications and made adjustments as needed Consults: Neurosurgery        Final Clinical Impression(s) / ED Diagnoses Final  diagnoses:  Cerebellar cyst  Dizziness  Nausea and vomiting, unspecified vomiting type    Rx / DC Orders ED Discharge Orders     None         Rondel Baton, MD 03/28/23 1958

## 2023-03-28 NOTE — ED Provider Notes (Signed)
9:40 PM Assumed care of patient from off-going team. For more details, please see note from same day.  In brief, this is a 57 y.o. female w/ renal transplant who p/w vertigo. Acute onset 8-9 am with N/V/dizziness. Tried MRI but started vomiting, treating w/ IV antiemetics. CTH shows cyst in cerebellum, but consulted neurology who stated that they don't think it's related and she can f/u o/p for that. TMs look okay. Renal function looks okay. On mycophenolate, not tac.  Fluids, scopolamine, meclizine, droperidol, zofran, benadryl. Tried to walk twice and hasn't been able.   Plan/Dispo at time of sign-out & ED Course since sign-out: [ ]  MRI, admit if unable to ambulate d/t vertigo.   BP 120/78   Pulse 83   Temp 97.8 F (36.6 C)   Resp 18   SpO2 99%    ED Course:   Clinical Course as of 03/28/23 2140  Mon Mar 28, 2023  1546 Catherine Walker from NSGY consulted [RP]  1648 Unable to tolerate MRI [RP]  1927 MR Brain W and Wo Contrast 1. No acute intracranial abnormality. 2. 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 No follow-up imaging is recommended.   [HN]  2007 Discussed with patient who states she is feeling mildly better.  She refused the scopolamine patch because she states she has had it once in the past and it made her vision blurry and made her feel funny.  I discussed with the patient her options including trying again to ambulate or trying diazepam.  Patient states she is reluctant to try additional medication and would like to try ambulating again with the nurse. [HN]  2131 D/w patient again who ambulated. Still feeling mildly dizzy but better than before. Would like to try to scopolamine patch and then be discharged. Will prescribe meclizine and zofran ODT for home. Encouraged to f/u with PCP within 1week and will place order for vestibular PT. Given extensive DC instructions/return precautions, all questions  answered to patient's satisfaction. [HN]    Clinical Course User Index [HN] Catherine Rough, MD [RP] Catherine Baton, MD    Dispo: DC w/ discharge instructions/return precautions. All questions answered to patient's satisfaction.   ------------------------------- Catherine Barrack, MD Emergency Medicine  This note was created using dictation software, which may contain spelling or grammatical errors.   Catherine Rough, MD 03/28/23 [phone]

## 2023-03-28 NOTE — Discharge Instructions (Addendum)
Thank you for coming to [facility] Emergency Department. You were seen for vertigo.  This is likely due to a peripheral cause, such as BPPV. Please follow up with your primary care provider within 1 week.  We have placed an order for referral to vestibular physical therapy however your primary care physician can refer you if it does not go through.  We have prescribed meclizine as well as Zofran under the tongue to use for dizziness and nausea and vomiting.  You can leave the scopolamine patch on for 72 hours which was placed here today.  Incidentally we also found a cyst in your cerebellum that is likely benign.  You can follow-up outpatient with neurosurgery for this problem.  You can call them tomorrow and follow-up sometime within the next several weeks.  Do not hesitate to return to the ED or call 911 if you experience: -Worsening symptoms -Nausea/vomiting so severe you cannot eat/drink anything -Falls -Lightheadedness, passing out -Fevers/chills -Anything else that concerns you

## 2023-03-28 NOTE — ED Notes (Signed)
Patient transported to MRI 

## 2023-12-14 ENCOUNTER — Other Ambulatory Visit: Payer: Self-pay | Admitting: Family Medicine

## 2023-12-14 DIAGNOSIS — I10 Essential (primary) hypertension: Secondary | ICD-10-CM

## 2023-12-14 DIAGNOSIS — E7800 Pure hypercholesterolemia, unspecified: Secondary | ICD-10-CM
